# Patient Record
Sex: Male | Born: 1945 | Race: Black or African American | Hispanic: No | Smoking: Never smoker
Health system: Southern US, Community
[De-identification: ages and names within clinical notes are randomized; demographics above are authoritative.]

## PROBLEM LIST (undated history)

## (undated) DIAGNOSIS — K4090 Unilateral inguinal hernia, without obstruction or gangrene, not specified as recurrent: Secondary | ICD-10-CM

## (undated) DIAGNOSIS — N400 Enlarged prostate without lower urinary tract symptoms: Secondary | ICD-10-CM

## (undated) DIAGNOSIS — R972 Elevated prostate specific antigen [PSA]: Secondary | ICD-10-CM

## (undated) DIAGNOSIS — I10 Essential (primary) hypertension: Secondary | ICD-10-CM

## (undated) DIAGNOSIS — K469 Unspecified abdominal hernia without obstruction or gangrene: Secondary | ICD-10-CM

## (undated) DIAGNOSIS — C61 Malignant neoplasm of prostate: Secondary | ICD-10-CM

## (undated) DIAGNOSIS — Z923 Personal history of irradiation: Secondary | ICD-10-CM

## (undated) DIAGNOSIS — E785 Hyperlipidemia, unspecified: Secondary | ICD-10-CM

## (undated) DIAGNOSIS — T7840XA Allergy, unspecified, initial encounter: Secondary | ICD-10-CM

## (undated) HISTORY — DX: Hyperlipidemia, unspecified: E78.5

## (undated) HISTORY — DX: Benign prostatic hyperplasia without lower urinary tract symptoms: N40.0

## (undated) HISTORY — DX: Essential (primary) hypertension: I10

## (undated) HISTORY — DX: Elevated prostate specific antigen (PSA): R97.20

## (undated) HISTORY — DX: Allergy, unspecified, initial encounter: T78.40XA

## (undated) HISTORY — DX: Malignant neoplasm of prostate: C61

## (undated) HISTORY — PX: HERNIA REPAIR: SHX51

## (undated) HISTORY — DX: Unspecified abdominal hernia without obstruction or gangrene: K46.9

---

## 1998-09-16 ENCOUNTER — Emergency Department (HOSPITAL_COMMUNITY): Admission: EM | Admit: 1998-09-16 | Discharge: 1998-09-16 | Payer: Self-pay | Admitting: Emergency Medicine

## 2010-11-15 ENCOUNTER — Encounter (INDEPENDENT_AMBULATORY_CARE_PROVIDER_SITE_OTHER): Payer: Self-pay | Admitting: Surgery

## 2010-11-18 ENCOUNTER — Encounter (INDEPENDENT_AMBULATORY_CARE_PROVIDER_SITE_OTHER): Payer: Self-pay | Admitting: Surgery

## 2010-11-20 ENCOUNTER — Encounter (INDEPENDENT_AMBULATORY_CARE_PROVIDER_SITE_OTHER): Payer: Self-pay | Admitting: Surgery

## 2010-11-20 ENCOUNTER — Ambulatory Visit (INDEPENDENT_AMBULATORY_CARE_PROVIDER_SITE_OTHER): Payer: Medicare Other | Admitting: Surgery

## 2010-11-20 VITALS — BP 158/106 | HR 80 | Temp 98.2°F | Ht 68.25 in | Wt 188.0 lb

## 2010-11-20 DIAGNOSIS — K4090 Unilateral inguinal hernia, without obstruction or gangrene, not specified as recurrent: Secondary | ICD-10-CM | POA: Insufficient documentation

## 2010-11-20 NOTE — Progress Notes (Signed)
Matthew Wilkerson is a 65 y.o. male.    Chief Complaint  Patient presents with  . Inguinal Hernia    HPI HPI This is a 65 year old male who recently saw Dr. Isabel Caprice for an elevated PSA. The patient is scheduled for a transrectal ultrasound and prostate biopsy. The patient has had a left groin bulge for at least 9 years. It has become very large and occasionally is uncomfortable. It remains reducible. He had a right inguinal hernia repaired in 1979. He denies any GI obstructive symptoms. The hernia occasionally becomes uncomfortable with urination.  Past Medical History  Diagnosis Date  . Hyperlipidemia   . Hypertension   . Elevated PSA   . Urinary obstruction   . Hernia     Past Surgical History  Procedure Date  . Hernia repair     Family History  Problem Relation Age of Onset  . Cancer Sister     Social History History  Substance Use Topics  . Smoking status: Never Smoker   . Smokeless tobacco: Never Used  . Alcohol Use: No    Allergies  Allergen Reactions  . Aspirin     High dose asp.... He tolerates 81 mg     Current Outpatient Prescriptions  Medication Sig Dispense Refill  . amLODipine (NORVASC) 10 MG tablet Take 10 mg by mouth daily.        Marland Kitchen aspirin 81 MG tablet Take 81 mg by mouth daily.        . metoprolol (TOPROL-XL) 50 MG 24 hr tablet Take 50 mg by mouth daily.        . simvastatin (ZOCOR) 40 MG tablet Take 40 mg by mouth at bedtime.        . traMADol (ULTRAM) 50 MG tablet Take 50 mg by mouth every 6 (six) hours as needed.          Review of Systems ROS Positive for dentures hernia occasional headaches, otherwise negative Physical Exam Physical Exam   Blood pressure 158/106, pulse 80, temperature 98.2 F (36.8 C), height 5' 8.25" (1.734 m), weight 188 lb (85.276 kg). WDWN in NAD HEENT:  EOMI, sclera anicteric Neck:  No masses, no thyromegaly Lungs:  CTA bilaterally; normal respiratory effort CV:  Regular rate and rhythm; no murmurs Abd:   +bowel sounds, soft, non-tender, no masses GU:  Healed right inguinal incision with no hernia;  Very large left inguinal hernia; partially reducible; no testicular masses Ext:  Well-perfused; no edema Skin:  Warm, dry; no sign of jaundice  Assessment/Plan 1.  Very large left inguinal hernia 2.  Elevated PSA - biopsy pending  Plan:  Once the biopsy is complete, if the patient does not need any further work-up or treatment for his prostate, I would recommend a left inguinal hernia repair with mesh.  I described the procedure in detail.  The patient was given educational material. We discussed the risks and benefits including but not limited to bleeding, infection, chronic inguinal pain, nerve entrapment, hernia recurrence, mesh complications, hematoma formation, urinary retention, injury to the testicles or the ovaries, numbness in the groin, blood clots, injury to the surrounding structures, and anesthesia risk. We also discussed the typical post operative recovery course, including no heavy lifting for 6 weeks.  Gabreille Dardis K. 11/20/2010, 3:22 PM

## 2010-11-20 NOTE — Patient Instructions (Signed)
Once Dr. Isabel Caprice has given you the go-ahead, please call our schedulers at [phone] to schedule your hernia repair.

## 2011-02-14 ENCOUNTER — Other Ambulatory Visit (HOSPITAL_COMMUNITY): Payer: Medicare Other

## 2011-02-18 ENCOUNTER — Ambulatory Visit (HOSPITAL_COMMUNITY): Admission: RE | Admit: 2011-02-18 | Payer: Medicare Other | Source: Ambulatory Visit | Admitting: Surgery

## 2011-02-24 ENCOUNTER — Other Ambulatory Visit (INDEPENDENT_AMBULATORY_CARE_PROVIDER_SITE_OTHER): Payer: Self-pay | Admitting: Surgery

## 2011-04-01 ENCOUNTER — Telehealth (INDEPENDENT_AMBULATORY_CARE_PROVIDER_SITE_OTHER): Payer: Self-pay | Admitting: General Surgery

## 2011-04-01 NOTE — Telephone Encounter (Signed)
Called pt with of per op ov before sx for [facility] on 05/05/11 and mailed a reminder card to pt

## 2011-04-29 ENCOUNTER — Encounter (INDEPENDENT_AMBULATORY_CARE_PROVIDER_SITE_OTHER): Payer: Medicare Other | Admitting: Surgery

## 2011-05-05 ENCOUNTER — Ambulatory Visit (HOSPITAL_COMMUNITY): Admission: RE | Admit: 2011-05-05 | Payer: Medicare Other | Source: Ambulatory Visit | Admitting: Surgery

## 2011-05-05 ENCOUNTER — Encounter (HOSPITAL_COMMUNITY): Admission: RE | Payer: Self-pay | Source: Ambulatory Visit

## 2011-05-05 SURGERY — REPAIR, HERNIA, INGUINAL, ADULT
Anesthesia: General | Laterality: Left

## 2011-05-06 ENCOUNTER — Ambulatory Visit (INDEPENDENT_AMBULATORY_CARE_PROVIDER_SITE_OTHER): Payer: Medicare Other | Admitting: Emergency Medicine

## 2011-05-06 DIAGNOSIS — I10 Essential (primary) hypertension: Secondary | ICD-10-CM

## 2011-05-06 DIAGNOSIS — C61 Malignant neoplasm of prostate: Secondary | ICD-10-CM

## 2011-05-06 DIAGNOSIS — E785 Hyperlipidemia, unspecified: Secondary | ICD-10-CM

## 2011-07-20 ENCOUNTER — Other Ambulatory Visit: Payer: Self-pay | Admitting: Emergency Medicine

## 2011-07-28 ENCOUNTER — Ambulatory Visit (INDEPENDENT_AMBULATORY_CARE_PROVIDER_SITE_OTHER): Payer: Medicare Other | Admitting: Family Medicine

## 2011-07-28 VITALS — BP 135/84 | HR 71 | Temp 97.6°F | Resp 16 | Ht 67.25 in | Wt 191.6 lb

## 2011-07-28 DIAGNOSIS — I10 Essential (primary) hypertension: Secondary | ICD-10-CM

## 2011-07-28 DIAGNOSIS — S058X9A Other injuries of unspecified eye and orbit, initial encounter: Secondary | ICD-10-CM

## 2011-07-28 DIAGNOSIS — E785 Hyperlipidemia, unspecified: Secondary | ICD-10-CM

## 2011-07-28 DIAGNOSIS — E782 Mixed hyperlipidemia: Secondary | ICD-10-CM

## 2011-07-28 DIAGNOSIS — S0500XA Injury of conjunctiva and corneal abrasion without foreign body, unspecified eye, initial encounter: Secondary | ICD-10-CM

## 2011-07-28 MED ORDER — SIMVASTATIN 40 MG PO TABS: 40.0000 mg | ORAL_TABLET | Freq: Every day | ORAL | Status: DC

## 2011-07-28 MED ORDER — METOPROLOL SUCCINATE ER 100 MG PO TB24: 100.0000 mg | ORAL_TABLET | Freq: Every day | ORAL | Status: DC

## 2011-07-28 MED ORDER — POLYMYXIN B-TRIMETHOPRIM 10000-0.1 UNIT/ML-% OP SOLN: 1.0000 [drp] | OPHTHALMIC | Status: AC

## 2011-07-28 MED ORDER — AMLODIPINE BESYLATE 10 MG PO TABS: 10.0000 mg | ORAL_TABLET | Freq: Every day | ORAL | Status: DC

## 2011-07-28 NOTE — Progress Notes (Signed)
  Patient Name: Matthew Wilkerson Date of Birth: [birth year] Medical Record Number: [id number] Gender: male Date of Encounter: 07/28/2011  History of Present Illness:  Matthew Wilkerson is a 66 y.o. very pleasant male patient who presents with the following:  Noted that he got some dust or maybe a leaf in his eye this morning around 11:30.  He was outdoors and felt something go into his eye.  He now feels that he needs to rub his eye his eye in order to focus. He wears reading glasses only.  He does feel that his vision is as good as usual, and he does not note photophobia. He is aware that his right eye vision is not very good, but this is not new  Patient Active Problem List  Diagnoses  . Left inguinal hernia   Past Medical History  Diagnosis Date  . Hyperlipidemia   . Hypertension   . Elevated PSA   . Urinary obstruction   . Hernia    Past Surgical History  Procedure Date  . Hernia repair    History  Substance Use Topics  . Smoking status: Never Smoker   . Smokeless tobacco: Never Used  . Alcohol Use: No   Family History  Problem Relation Age of Onset  . Cancer Sister    Allergies  Allergen Reactions  . Aspirin     High dose asp.... He tolerates 81 mg     Medication list has been reviewed and updated.  Review of Systems: 1. Corneal abrasion  trimethoprim-polymyxin b (POLYTRIM) ophthalmic solution  2. Hypertension  metoprolol succinate (TOPROL-XL) 100 MG 24 hr tablet, amLODipine (NORVASC) 10 MG tablet  3. Hyperlipidemia  simvastatin (ZOCOR) 40 MG tablet     Physical Examination: Filed Vitals:   07/28/11 1517  BP: 135/84  Pulse: 71  Temp: 97.6 F (36.4 C)  TempSrc: Oral  Resp: 16  Height: 5' 7.25" (1.708 m)  Weight: 191 lb 9.6 oz (86.909 kg)    Body mass index is 29.79 kg/(m^2).  GEN: WDWN, NAD, Non-toxic, A & O x 3 HEENT: Atraumatic, Normocephalic. Neck supple. No masses, No LAD PEERL, EOMI, fundoscopic exam normal.  conjunctivae normal Ears and  Nose: No external deformity. CV: RRR, No M/G/R. No JVD. No thrill. No extra heart sounds. PULM: CTA B, no wheezes, crackles, rhonchi. No retractions. No resp. distress. No accessory muscle use. EXTR: No c/c/e NEURO Normal gait.  PSYCH: Normally interactive. Conversant. Not depressed or anxious appearing.  Calm demeanor.   Applied proparacaine drops to right eye and then performed fluorescin stain.  No abrasion but did visualize a small foreign body which I was able to flush from the eye   Assessment and Plan: 1. Corneal abrasion  trimethoprim-polymyxin b (POLYTRIM) ophthalmic solution  2. Hypertension  metoprolol succinate (TOPROL-XL) 100 MG 24 hr tablet, amLODipine (NORVASC) 10 MG tablet  3. Hyperlipidemia  simvastatin (ZOCOR) 40 MG tablet   Refilled other medications today- encouraged him to RTC for fasting labs and a recheck later this summer.  Foreign body to eye causing irritation- removed as above.  Gave polytrim drops to use for a few days. If not improving by tomorrow please call.

## 2011-07-29 ENCOUNTER — Telehealth: Payer: Self-pay

## 2011-07-29 NOTE — Telephone Encounter (Signed)
I think the best would be for the patient to RTC - if a corneal abrasion it should take 1-2d to heal, pt can also get some dry eye from irritation to eye that could be causing blurred vision due to lack of tears.

## 2011-07-29 NOTE — Telephone Encounter (Signed)
Spoke to pt,  Soreness gone, has to rub eye to focus.  Not blurred all the time, how long will it be until his eye is no longer blurry.  Please advise.

## 2011-07-29 NOTE — Telephone Encounter (Signed)
Spoke to pt, he is aware.  He will rtc if blurred vision has not resolved by tomorrow.  Informed of Dr. Brayton Layman schedule, and that anyone could see him.

## 2011-07-29 NOTE — Telephone Encounter (Signed)
Patient would like to speak to Dr. Patsy Lager because his symptoms have changed. He still is having blurred vision. Please advise.

## 2011-08-01 ENCOUNTER — Encounter: Payer: Self-pay | Admitting: Internal Medicine

## 2011-08-01 ENCOUNTER — Ambulatory Visit (INDEPENDENT_AMBULATORY_CARE_PROVIDER_SITE_OTHER): Payer: Medicare Other | Admitting: Internal Medicine

## 2011-08-01 VITALS — BP 121/80 | HR 94 | Temp 98.5°F | Resp 16 | Ht 67.0 in | Wt 191.4 lb

## 2011-08-01 DIAGNOSIS — I10 Essential (primary) hypertension: Secondary | ICD-10-CM

## 2011-08-01 DIAGNOSIS — H5710 Ocular pain, unspecified eye: Secondary | ICD-10-CM

## 2011-08-01 DIAGNOSIS — H538 Other visual disturbances: Secondary | ICD-10-CM

## 2011-08-01 NOTE — Patient Instructions (Signed)
Keratoconjunctivitis Sicca Keratoconjunctivitis sicca (dry eye) is dryness of the membranes surrounding the eye. It is caused by inadequate production of natural tears. The eyes must remain lubricated at all times. This creates a smooth surface of the cornea (clear covering at the front of the eye), which allows the eyelids to slide over the eye, without causing soreness and irritation. A small amount of tears are constantly produced by the tear glands (lacrimal glands). These glands are located under the outside part of the upper eyelids. Dry eyes are often caused by decreased tear production. This condition is called aqueous tear-deficient dry eyes. The tear glands do not produce enough tears to keep the tissues surrounding the eye moist. This condition is common in postmenopausal women. Dry eyes may also be caused by an abnormality of how the tears are made. This can result in faster evaporation of the tears. It is called evaporative dry eyes. Although the tear glands produce enough tears, the rate of evaporation is so fast that the entire eye surface cannot be kept covered with a complete layer of tears. The condition can occur during certain activities or in unusually dry surroundings.  Sometimes dry eyes are symptoms of other diseases that can affect the eyes. Some examples are:   Rheumatoid arthritis.   Systemic lupus erythematosus (lupus). Chronic inflammatory disease, causing the body to attack its own tissue.   Sjgren's syndrome. Dryness of the eyes and mouth, sometimes associated with a form of rheumatoid arthritis or lupus.  SYMPTOMS  Symptoms of dry eyes include:  Irritation.   Itching.   Redness.   Burning and feeling as though there is something gritty in the eye.  If the surface of the eye becomes damaged, sensitivity may increase, along with increased discomfort and sensitivity to bright light. Symptoms are made worse by activities with decreased blinking, such as staring at a  television, book, or computer screen.  Symptoms are also worse in dusty or smoky areas and in dry environments. Certain drugs can make symptoms worse, including antihistamines, tranquilizers, diuretics, anti-hypertensives (blood pressure medicine), and oral contraceptives. Symptoms tend to improve during cool, rainy, or foggy weather and in humid places, such as in the shower. DIAGNOSIS  Dry eyes are usually diagnosed by symptoms alone. Sometimes a Schirmer test is done, in which a strip of filter paper is placed at the edge of the eyelid. The amount of moisture bathing the eye is measured. This test is done by measuring how far the tears go up a strip of paper applied to the eye, in a set amount of time. Your caregiver will use a special microscope to examine the surface of the eye and the cornea, to see if there are signs of dryness. TREATMENT  Artificial tears (eye drops made with substances that simulate real tears) applied every few hours can generally control the problem. Lubricating ointments may help more severe cases. Avoiding dry, drafty environments and using humidifiers may also help. Minor surgery can be done to block the flow of tears into the nose, so that more tears are available to bathe the eyes. Patients with evaporative dry eyes may also benefit from treatment of the inflammation (redness and soreness) of the eyelids, which often occurs with the dry eyes. This treatment may include warm compresses, eyelid margin scrubs, or oral medicines. PROGNOSIS  Even with severe dry eyes, it is uncommon to lose vision. At times, it may become difficult to see. Rarely, scarring and ulcers may occur, and blood vessels can  grow across the cornea. Scarring and blood vessel growth can impair vision. In severe cases, the cornea may become damaged or infected. Ulcers or infections of the cornea are serious complications. PREVENTION  There is no way to prevent getting keratoconjunctivitis sicca. However,  complications can be prevented by keeping the eyes as moist as possible with artificial drops, as needed. SEEK IMMEDIATE MEDICAL CARE IF:   Your eye pain gets worse.   You develop a pus-like drainage from the eye.   The eye drops or medicines prescribed by your caregiver are not helping.   You have dry eyes and a sudden increase in discomfort or redness.   You have a sudden decrease in vision.   You have any other questions or concerns.  Document Released: 02/23/2004 Document Revised: 03/27/2011 Document Reviewed: 02/26/2009 [facility] Patient Information 2012 [place], [place].

## 2011-08-01 NOTE — Progress Notes (Signed)
  Subjective:    Patient ID: Matthew Wilkerson, male    DOB: [birth year], 66 y.o.   MRN: [id number]  HPI C/o R eye irritation persisting. Has no pain or discharge. Has more blurry R than L. Has not seen opthalmlogist in years.    Review of Systems Dr. Cleta Alberts FP    Objective:   Physical Exam Right eye appears normal Perrla, eom normal Fundascopic normal Opthain---lids everted and swabbed, no FB found Fluricine no FB or corneal lesion irrigated  Normal eye exam                 Assessment & Plan:  See opthalmologist names given Stop antibiotic drops Use artificial tears.

## 2011-08-07 ENCOUNTER — Ambulatory Visit: Payer: Medicare Other | Admitting: Family Medicine

## 2011-09-16 ENCOUNTER — Ambulatory Visit: Payer: Medicare Other | Admitting: Emergency Medicine

## 2011-09-20 DIAGNOSIS — R972 Elevated prostate specific antigen [PSA]: Secondary | ICD-10-CM

## 2011-09-20 HISTORY — DX: Elevated prostate specific antigen (PSA): R97.20

## 2012-01-08 DIAGNOSIS — C61 Malignant neoplasm of prostate: Secondary | ICD-10-CM

## 2012-01-08 HISTORY — DX: Malignant neoplasm of prostate: C61

## 2012-01-08 HISTORY — PX: PROSTATE BIOPSY: SHX241

## 2012-01-26 ENCOUNTER — Encounter: Payer: Self-pay | Admitting: *Deleted

## 2012-01-27 ENCOUNTER — Encounter: Payer: Self-pay | Admitting: Radiation Oncology

## 2012-01-27 ENCOUNTER — Ambulatory Visit
Admission: RE | Admit: 2012-01-27 | Discharge: 2012-01-27 | Disposition: A | Discharge status: Home / Self Care | Payer: Medicare Other | Source: Ambulatory Visit | Attending: Radiation Oncology | Admitting: Radiation Oncology

## 2012-01-27 VITALS — BP 144/73 | HR 85 | Temp 98.5°F | Resp 20 | Wt 196.6 lb

## 2012-01-27 DIAGNOSIS — C61 Malignant neoplasm of prostate: Secondary | ICD-10-CM | POA: Insufficient documentation

## 2012-01-27 HISTORY — DX: Unilateral inguinal hernia, without obstruction or gangrene, not specified as recurrent: K40.90

## 2012-01-27 NOTE — Progress Notes (Signed)
New Consult Prostate Cancer DX 11/2010  BX:01/08/12 Adenocarcinoma,gleason=3+3=6,& 3+4=7,Volume=43cc,PSA+7.63 09/2011 PSA=7.6 04/2011 PSA=7.7  Single, 2 children [employer]  No dysuria, gets up 1-2x night to void, sometimes feels not emptying bladder completely, some urgency at times Allergies: Asa, upset stomach but takes low dose asa, . TBEC, ,Codeine derivatives=dizziness

## 2012-01-27 NOTE — Progress Notes (Signed)
[facility] Radiation Oncology NEW PATIENT EVALUATION  Name: Matthew Wilkerson MRN: [id number]  Date:   01/27/2012           DOB: 11-21-1945  Status: outpatient   CC: DAUB, Stan Head, MD  Valetta Fuller, MD    REFERRING PHYSICIAN: Valetta Fuller, MD   DIAGNOSIS: Stage TI C. intermediate risk adenocarcinoma prostate   HISTORY OF PRESENT ILLNESS:  Matthew Wilkerson is a 66 y.o. male who is seen today for the courtesy Dr. Barron Alvine for discussion of possible radiation therapy in the management of his stage TI C. intermediate risk adenocarcinoma prostate. He was diagnosed with prostate cancer on 12/10/2010 after he presented with a PSA of 7.18. His found to have risen 6 (3+3) involving 5% of one core from the right base, 5% of one core from the right mid gland, 10% of one core from right lateral apex and 10% of one core from the right apex. He also had multiple areas of high-grade PIN. He elected for close surveillance. His PSA from 09/25/2011 was 7.63. He underwent repeat biopsies on 01/08/2012. He was again found have Gleason 6 (3+3) involving less than 5% of one core from right lateral base, less than 5% of one core from the right base, 5% of one core from the right lateral apex, but also Gleason 7 (3+4) involving 20% of one core from the left apex. His gland volume was 43 cc. His I PSS score Dr. Isabel Caprice was 8 and also 19 in our office today. He was given a trial of VESIcare, but this was poorly tolerated, causing some discomfort with urination. I he was also given a trial of an alpha blocker which was also poorly tolerated. No GI difficulties. He does have erectile dysfunction.  PREVIOUS RADIATION THERAPY: No   PAST MEDICAL HISTORY:  has a past medical history of Hyperlipidemia; Hypertension; Elevated PSA; Urinary obstruction; Hernia; Allergy; Prostate cancer (dx 11/2010); Prostate ca (01/08/12); Abnormal PSA (09/2011); BPH (benign prostatic hyperplasia); and Inguinal hernia.      PAST SURGICAL HISTORY:  Past Surgical History  Procedure Date  . Hernia repair     right inguinal done in early 70's  . Prostate biopsy 01/08/12    Adenocarcinoma,gleason=3+3=6,& 3+4=7,volume=43cc      FAMILY HISTORY: family history includes Cancer (age of onset:59) in his sister.   SOCIAL HISTORY:  reports that he has never smoked. He has never used smokeless tobacco. He reports that he does not drink alcohol or use illicit drugs.   ALLERGIES: Aspirin and Codeine   MEDICATIONS:  Current Outpatient Prescriptions  Medication Sig Dispense Refill  . amLODipine (NORVASC) 10 MG tablet Take 1 tablet (10 mg total) by mouth daily.  90 tablet  3  . aspirin 81 MG tablet Take 81 mg by mouth daily.        . metoprolol succinate (TOPROL-XL) 100 MG 24 hr tablet Take 1 tablet (100 mg total) by mouth daily. Take with or immediately following a meal.  90 tablet  3  . simvastatin (ZOCOR) 40 MG tablet Take 1 tablet (40 mg total) by mouth at bedtime.  90 tablet  3  . traMADol (ULTRAM) 50 MG tablet Take 50 mg by mouth every 6 (six) hours as needed.           REVIEW OF SYSTEMS:  Pertinent items are noted in HPI.    PHYSICAL EXAM:  weight is 196 lb 9.6 oz (89.177 kg). His oral temperature is  98.5 F (36.9 C). His blood pressure is 144/73 and his pulse is 85. His respiration is 20.   Head and neck examination: Grossly unremarkable. Nodes: Without palpable cervical or supraclavicular lymphadenopathy. Chest: Lungs clear. Heart: Regular rate rhythm. Back: Without spinal or CVA tenderness. Abdomen: Without masses organomegaly. He does have moderate sized left inguinal hernia. Genitalia: Grossly unremarkable. Rectal: The prostate gland is normal size and is without focal induration or nodularity. Extremities: Without edema. Neurologic examination: Grossly nonfocal.   LABORATORY DATA:  No results found for this basename: WBC, HGB, HCT, MCV, PLT   No results found for this basename: NA, K, CL, CO2    No results found for this basename: ALT, AST, GGT, ALKPHOS, BILITOT   PSA 7.63 from 09/25/2011.    IMPRESSION: Stage TI C. intermediate risk adenocarcinoma prostate. I explained to the patient and his wife that his prognosis is related to his stage, PSA level, and Gleason score. His stage and PSA level are favorable while his Gleason score of 7 is of intermediate favorability. Other prognostic factors include PSA doubling time and disease volume. We discussed surgery versus continued observation versus radiation therapy. Ration therapy options include seed implantation with or without 5 weeks of external beam or 8 weeks of external beam/IMRT. I am concerned about his obstructive symptoms score of 19. I believe that he has been given a trial of both antispasmodic and an alpha blocker which were both poorly tolerated. I would be reluctant to implant the patient with an I PSS score greater than 15. I'll see Dr. Isabel Caprice to see if he needs to undergo urodynamics to better assess his suspected obstructive symptomatology. At this point in time we would be left with 8 weeks of external beam/IMRT. I discussed the potential acute and late toxicities of radiation therapy, and he is somewhat interested in external beam/IMRT. On other hand, I feel that he would also be a candidate for seed implantation alone, that we can improve his I PSS score of 19. Consent for possible radiation therapy is signed today. I left a message with Dr. Isabel Caprice, and we can discuss his management.   PLAN: As discussed above.   I spent 60 minutes minutes face to face with the patient and more than 50% of that time was spent in counseling and/or coordination of care.

## 2012-01-27 NOTE — Progress Notes (Signed)
Please see the Nurse Progress Note in the MD Initial Consult Encounter for this patient. 

## 2012-01-28 ENCOUNTER — Encounter: Payer: Self-pay | Admitting: Radiation Oncology

## 2012-01-28 NOTE — Progress Notes (Signed)
I spoke with Dr. Isabel Caprice earlier today. In view of his I PSS score of 19, feel that we should further evaluate his "obstructive" symptoms with urodynamics. He is currently not a candidate for seed implantation based on his I PSS score is 19. Even though he is somewhat leaning towards external beam/IMRT, I would like to optimize his urinary status. Dr. Isabel Caprice will get him in and let me know when he is to of his evaluation.

## 2012-02-06 NOTE — Addendum Note (Signed)
Encounter addended by: Lowella Petties, RN on: 02/06/2012 12:04 PM<BR>     Documentation filed: Charges VN

## 2012-06-14 ENCOUNTER — Encounter: Payer: Self-pay | Admitting: Radiation Oncology

## 2012-06-14 NOTE — Progress Notes (Signed)
Dr. Isabel Caprice called me this morning to tell me that Matthew Wilkerson has better urinary flow. He was started on Lupron in late December and should be ready to begin radiation therapy sometime in March. I requested that we place 3 gold seed markers within the prostate. I will see him for a followup visit within the next 2-3 weeks and then get him scheduled for simulation/IMRT.

## 2012-06-16 ENCOUNTER — Telehealth: Payer: Self-pay | Admitting: *Deleted

## 2012-06-16 NOTE — Telephone Encounter (Signed)
CALLED PATIENT TO INFORM OF APPT. WITH DR. Dayton Scrape ON 07-06-12- ARRIVAL TIME - 2:00 PM, I ALSO TOLD HIM THAT I HAVE HIS GOLD SEEDS ARRANGED TO BE PLACED ON 08-20-12 AT 1:30 PM AT DR. Ellin Goodie  OFFICE, I LVM FOR DR. Ellin Goodie NURSE TO GET HIM IN [facility] [place] FOR GOLD SEED PLACEMENT , I TOLD HIM THAT I WOULD CALL HIM LATER WITH THAT NEW DATE.

## 2012-06-17 ENCOUNTER — Telehealth: Payer: Self-pay | Admitting: *Deleted

## 2012-06-17 NOTE — Telephone Encounter (Signed)
CALLED PATIENT TO INFORM OF GOLD SEED PLACEMENT FOR 07-13-12 - ARRIVAL TIME - 1:15 PM AT DR. Ellin Goodie OFFICE AND HIS VISIT WITH DR. Dayton Scrape ON 07-14-12 - ARRIVAL TIME - 2;00 PM, SPOKE WITH PATIENT AND HE IS AWARE OF THESE APPTS. AND I HAVE MAILED THE PATIENT INFO. REGARDING THESE APPTS.

## 2012-07-06 ENCOUNTER — Ambulatory Visit: Payer: Medicare Other

## 2012-07-06 ENCOUNTER — Ambulatory Visit: Payer: Medicare Other | Admitting: Radiation Oncology

## 2012-07-09 ENCOUNTER — Encounter: Payer: Self-pay | Admitting: Oncology

## 2012-07-14 ENCOUNTER — Encounter: Payer: Self-pay | Admitting: Radiation Oncology

## 2012-07-14 ENCOUNTER — Ambulatory Visit
Admission: RE | Admit: 2012-07-14 | Discharge: 2012-07-14 | Disposition: A | Discharge status: Home / Self Care | Payer: Medicare Other | Source: Ambulatory Visit | Attending: Radiation Oncology | Admitting: Radiation Oncology

## 2012-07-14 VITALS — BP 136/81 | HR 66 | Temp 97.7°F | Resp 20 | Wt 202.7 lb

## 2012-07-14 DIAGNOSIS — C61 Malignant neoplasm of prostate: Secondary | ICD-10-CM | POA: Insufficient documentation

## 2012-07-14 DIAGNOSIS — R351 Nocturia: Secondary | ICD-10-CM | POA: Insufficient documentation

## 2012-07-14 DIAGNOSIS — R3915 Urgency of urination: Secondary | ICD-10-CM | POA: Insufficient documentation

## 2012-07-14 NOTE — Progress Notes (Addendum)
CC: Dr. Barron Alvine   Diagnosis: Stage TI C. intermediate risk adenocarcinoma prostate  History: Mr. Matthew Wilkerson returns today for review and scheduling of his radiation therapy. I first saw the patient consultation on 01/27/2012. He presented with Gleason 7 disease along with a PSA of 7.18. His gland volume was 43 cc. He had significant obstructive symptomatology with an I PSS score of 19. He is being considered for seed implantation versus external beam/IMRT. I do not feel that he would be a good candidate for seed implantation because of his obstructive symptomatology. He underwent video urodynamics and was given a trial of blockers and antispasmodic medication. There was minimal improvement. He was started on short course androgen deprivation this past December. His urination is improved and his I PSS score is 10. 3 gold seed fiducial markers were implanted by Dr. Isabel Caprice yesterday.  Physical examination: Alert and oriented. Wt Readings from Last 3 Encounters:  07/14/12 202 lb 11.2 oz (91.944 kg)  01/27/12 196 lb 9.6 oz (89.177 kg)  08/01/11 191 lb 6.4 oz (86.818 kg)   Temp Readings from Last 3 Encounters:  07/14/12 97.7 F (36.5 C) Oral  01/27/12 98.5 F (36.9 C) Oral  08/01/11 98.5 F (36.9 C) Oral   BP Readings from Last 3 Encounters:  07/14/12 136/81  01/27/12 144/73  08/01/11 121/80   Pulse Readings from Last 3 Encounters:  07/14/12 66  01/27/12 85  08/01/11 94   Rectal examination: The prostate gland is small and is without focal induration or nodularity.  Impression: Stage TI C. intermediate risk adenocarcinoma prostate. He was again discussed the potential acute and late toxicities of radiation therapy. We also discussed bladder filling. He has to 3 days in April that he would not be able to come in for treatment, therefore will have him return for treatment planning after his out-of-town obligations in mid April. Dr. Isabel Caprice may want to give one more Depo-Lupron injection  that we care he him through his external beam/IMRT. Consent was previously signed.  Plan: As discussed above.  30 minutes was spent face-to-face with the patient, primarily counseling the patient and coordinating his care.

## 2012-07-14 NOTE — Progress Notes (Signed)
Please see the Nurse Progress Note in the MD Initial Consult Encounter for this patient. 

## 2012-07-14 NOTE — Progress Notes (Addendum)
Had Lupron injection late Dec 2013, next dose 08/10/12. Pt states he has hot flashes. Pt has nocturia x2, urinary urgency, weak stream. IPSS score. Pt denies pain, fatigue, loss of appetite.

## 2012-07-14 NOTE — Addendum Note (Signed)
Encounter addended by: Maryln Gottron, MD on: 07/14/2012  3:25 PM<BR>     Documentation filed: Notes Section

## 2012-07-19 NOTE — Addendum Note (Signed)
Encounter addended by: Eduardo Osier, RN on: 07/19/2012  1:13 PM<BR>     Documentation filed: Charges VN

## 2012-08-07 ENCOUNTER — Other Ambulatory Visit: Payer: Self-pay | Admitting: Family Medicine

## 2012-08-09 ENCOUNTER — Telehealth: Payer: Self-pay | Admitting: Radiation Oncology

## 2012-08-09 ENCOUNTER — Ambulatory Visit
Admission: RE | Admit: 2012-08-09 | Discharge: 2012-08-09 | Disposition: A | Discharge status: Home / Self Care | Payer: Medicare Other | Source: Ambulatory Visit | Attending: Radiation Oncology | Admitting: Radiation Oncology

## 2012-08-09 DIAGNOSIS — Z510 Encounter for antineoplastic radiation therapy: Secondary | ICD-10-CM | POA: Insufficient documentation

## 2012-08-09 DIAGNOSIS — C61 Malignant neoplasm of prostate: Secondary | ICD-10-CM | POA: Insufficient documentation

## 2012-08-09 NOTE — Telephone Encounter (Signed)
Met w patient to discuss RO billing. Pt had no financial concerns today.  Dx: Prostate cancer - Primary 185   Attending Rad:  RM   Rad Tx:  IMRT

## 2012-08-09 NOTE — Progress Notes (Signed)
Complex CT simulation/treatment planning note: The patient was taken to the CT simulator and placed supine. A custom vac lock immobilization device was constructed. A rubber catheter was placed within the rectal vault. He was in catheterized and contrast instilled into his bladder and urethra. His pelvis was scanned. I contoured his prostate, seminal vesicles, bladder, and rectum. The femoral heads and bowel be contoured by dosimetry. I prescribing 7800 cGy 40 sessions to his prostate PTV which are represents the prostate was 0.8 cm except for 0.5 cm along the rectum. I prescribing 5600 cGy in 40 sessions to his seminal vesicles which represents the seminal vesicles +0.5 cm. He is now ready for IMRT simulation/treatment planning. He is be treated with a comfortably full bladder. He'll undergo daily image guidance with a weekly cone beam CT setting up to his gold seeds. I want to perform cone beam CT and not KV imaging because of 2 (adjacent to the rectum which may migrate.

## 2012-08-11 ENCOUNTER — Encounter: Payer: Self-pay | Admitting: Radiation Oncology

## 2012-08-11 NOTE — Progress Notes (Signed)
IMRT simulation/treatment planning note: Matthew Wilkerson completed his IMRT simulation/treatment planning in the management of his carcinoma the prostate. IMRT was chosen to decrease the risk for both acute and late bladder and rectal toxicity compared to conventional or 3-D conformal radiation therapy. Dose volume histograms were obtained for the target structures including the prostate and seminal vesicle PTV is also avoidance structures including the bladder, rectum, and femoral heads. We met our departmental goals. I'm prescribing 7800 cGy 40 sessions to the prostate PTV and 5600 cGy in 40 sessions to his seminal vesicle PTV. I requesting daily image guidance with cone beam CT to be a close eye on his gold seeds with one or 2 gold seeds appearing to be adjacent to the rectum. He is be treated with a comfortably full bladder.

## 2012-08-14 ENCOUNTER — Other Ambulatory Visit: Payer: Self-pay | Admitting: Family Medicine

## 2012-08-17 ENCOUNTER — Ambulatory Visit: Payer: Medicare Other | Admitting: Emergency Medicine

## 2012-08-17 ENCOUNTER — Ambulatory Visit (INDEPENDENT_AMBULATORY_CARE_PROVIDER_SITE_OTHER): Payer: Medicare Other | Admitting: Internal Medicine

## 2012-08-17 VITALS — BP 124/83 | HR 76 | Temp 98.1°F | Resp 18 | Wt 198.0 lb

## 2012-08-17 DIAGNOSIS — E785 Hyperlipidemia, unspecified: Secondary | ICD-10-CM

## 2012-08-17 DIAGNOSIS — R519 Headache, unspecified: Secondary | ICD-10-CM | POA: Insufficient documentation

## 2012-08-17 DIAGNOSIS — I10 Essential (primary) hypertension: Secondary | ICD-10-CM

## 2012-08-17 DIAGNOSIS — R51 Headache: Secondary | ICD-10-CM

## 2012-08-17 MED ORDER — AMLODIPINE BESYLATE 10 MG PO TABS: 10.0000 mg | ORAL_TABLET | Freq: Every day | ORAL | Status: DC

## 2012-08-17 MED ORDER — METOPROLOL SUCCINATE ER 100 MG PO TB24: 100.0000 mg | ORAL_TABLET | Freq: Every day | ORAL | Status: DC

## 2012-08-17 MED ORDER — SIMVASTATIN 40 MG PO TABS: 40.0000 mg | ORAL_TABLET | Freq: Every day | ORAL | Status: DC

## 2012-08-17 MED ORDER — TRAMADOL HCL 50 MG PO TABS: 50.0000 mg | ORAL_TABLET | Freq: Four times a day (QID) | ORAL | Status: DC | PRN

## 2012-08-17 NOTE — Progress Notes (Signed)
Subjective:    Patient ID: Matthew Wilkerson, male    DOB: [birth year], 67 y.o.   MRN: [id number]  HPI Pt presents to clinic today for refills for Norvasc, Toprol-XL, Ultram, and Zocor. Pt states he  Gets meds from Dr Cleta Alberts. See ref for prost-now seeing Dr Dayton Scrape for radiation Had PE plus labs done w/ cancer ctr 1 month ago and adamant that they checked all incl lipids Feels well HAs are seldom and resp to ultram  Past Medical History  Diagnosis Date  . Hyperlipidemia   . Hypertension   . Elevated PSA   . Urinary obstruction   . Hernia   . Allergy   . Prostate cancer dx 11/2010    prostate ca,PSA=7.2 T1c volume=30cc  . Prostate ca 01/08/12    bx=Adenocarcinoma, gleason 6 (3+3), prostate volume 43 cc  . Abnormal PSA 09/2011    7.63,   . BPH (benign prostatic hyperplasia)   . Inguinal hernia     left   Current outpatient prescriptions:amLODipine (NORVASC) 10 MG tablet, Take 1 tablet (10 mg total) by mouth daily., Disp: 30 tablet, Rfl: 5;  aspirin 81 MG tablet, Take 81 mg by mouth daily.  , Disp: , Rfl: ;  metoprolol succinate (TOPROL-XL) 100 MG 24 hr tablet, Take 1 tablet (100 mg total) by mouth daily. Take with or immediately following a meal., Disp: 30 tablet, Rfl: 5 traMADol (ULTRAM) 50 MG tablet, Take 1 tablet (50 mg total) by mouth every 6 (six) hours as needed., Disp: 30 tablet, Rfl: 0;  simvastatin (ZOCOR) 40 MG tablet, Take 1 tablet (40 mg total) by mouth at bedtime., Disp: 30 tablet, Rfl: 5  Denies side effects  Review of Systems  Constitutional: Negative for activity change, appetite change, fatigue and unexpected weight change.  HENT: Negative for hearing loss and neck stiffness.   Eyes: Negative for photophobia and visual disturbance.  Respiratory: Negative for shortness of breath.   Cardiovascular: Negative for chest pain, palpitations and leg swelling.  Gastrointestinal: Negative for abdominal pain, diarrhea and constipation.  Genitourinary: Negative for difficulty  urinating.  Musculoskeletal: Negative for gait problem.  Neurological: Negative for speech difficulty, weakness and light-headedness.  Psychiatric/Behavioral: Negative for behavioral problems and sleep disturbance. The patient is not nervous/anxious.        Objective:   Physical Exam BP 124/83  Pulse 76  Temp(Src) 98.1 F (36.7 C) (Oral)  Resp 18  Wt 198 lb (89.812 kg)  BMI 31 kg/m2 HEENT clear Heart regular without murmur rub or gallop No carotid bruits No peripheral edema Neurological intact       Assessment & Plan:  Hypertension - Other and unspecified hyperlipidemia  Headache  Meds ordered this encounter  Medications  . traMADol (ULTRAM) 50 MG tablet    Sig: Take 1 tablet (50 mg total) by mouth every 6 (six) hours as needed.    Dispense:  30 tablet    Refill:  0  . simvastatin (ZOCOR) 40 MG tablet    Sig: Take 1 tablet (40 mg total) by mouth at bedtime.    Dispense:  30 tablet    Refill:  5  . metoprolol succinate (TOPROL-XL) 100 MG 24 hr tablet    Sig: Take 1 tablet (100 mg total) by mouth daily. Take with or immediately following a meal.    Dispense:  30 tablet    Refill:  5  . amLODipine (NORVASC) 10 MG tablet    Sig: Take 1 tablet (10 mg total) by mouth  daily.    Dispense:  30 tablet    Refill:  5   Will have him return in 6 months for labs He is unsure about immunizations/his colonoscopy is up-to-date

## 2012-08-22 ENCOUNTER — Encounter: Payer: Self-pay | Admitting: Radiation Oncology

## 2012-08-22 NOTE — Progress Notes (Signed)
  Radiation Oncology         [phone] ________________________________  Name: Matthew Wilkerson MRN: [id number]  Date: 08/22/2012  DOB: 05-16-1945  On-call Telephone contact:  I received a message to call this patient and returned the phone call.  The patient's daughter answer the phone while he listened in on the other line. The patient's daughter related that the patient elected to proceed with radiation therapy before she had an opportunity to ask questions and discuss the treatment in detail. The patient was scheduled to begin radiation treatment on Monday, May 5. She requested that we cancel his first treatment appointment and set up time to talk about the planned course of therapy. I related this message to our scheduling staff voice mail.  ________________________________  Artist Pais. Kathrynn Running, M.D.

## 2012-08-23 ENCOUNTER — Ambulatory Visit
Admission: RE | Admit: 2012-08-23 | Discharge: 2012-08-23 | Disposition: A | Discharge status: Home / Self Care | Payer: Medicare Other | Source: Ambulatory Visit | Attending: Radiation Oncology | Admitting: Radiation Oncology

## 2012-08-23 VITALS — BP 135/78 | HR 73 | Temp 97.4°F | Wt 198.6 lb

## 2012-08-23 DIAGNOSIS — C61 Malignant neoplasm of prostate: Secondary | ICD-10-CM

## 2012-08-23 NOTE — Progress Notes (Signed)
Patient here for routine weekly assessment of radiation to pelvis for prostate cancer.Completed 1 of 40 treatments.Routine of clinic reviewed and general side effects.Given Radiation Therapy and You Booklet to review and will reinforce teaching and answer questions on Wednesday of this week.

## 2012-08-23 NOTE — Progress Notes (Signed)
Weekly Management Note:  Site: Prostate Current Dose:  195  cGy Projected Dose: 7800  cGy  Narrative: The patient is seen today for routine under treatment assessment. CBCT/MVCT images/port films were reviewed. The chart was reviewed.   No new GU or GI complaints. Bladder filling is excellent today. I had a lengthy discussion with his wife earlier today reviewing potential acute and late toxicities of radiation therapy. She was concerned he may have serious fatigue and become incapacitated.  Physical Examination:  Filed Vitals:   08/23/12 1138  BP: 135/78  Pulse: 73  Temp: 97.4 F (36.3 C)  .  Weight: 198 lb 9.6 oz (90.084 kg). No change.  Impression: Tolerating radiation therapy well.  Plan: Continue radiation therapy as planned.

## 2012-08-23 NOTE — Progress Notes (Signed)
Chart note: Mr. Matthew Wilkerson began his radiation therapy/IMRT today in the management of his carcinoma the prostate. He was treated with VMAT utilizing 2 modulated arcs with 2 dynamic sets of MLCs corresponding to one set of IMRT treatment devices [phone])

## 2012-08-24 ENCOUNTER — Ambulatory Visit
Admission: RE | Admit: 2012-08-24 | Discharge: 2012-08-24 | Disposition: A | Discharge status: Home / Self Care | Payer: Medicare Other | Source: Ambulatory Visit | Attending: Radiation Oncology | Admitting: Radiation Oncology

## 2012-08-25 ENCOUNTER — Ambulatory Visit
Admission: RE | Admit: 2012-08-25 | Discharge: 2012-08-25 | Disposition: A | Discharge status: Home / Self Care | Payer: Medicare Other | Source: Ambulatory Visit | Attending: Radiation Oncology | Admitting: Radiation Oncology

## 2012-08-26 ENCOUNTER — Ambulatory Visit
Admission: RE | Admit: 2012-08-26 | Discharge: 2012-08-26 | Disposition: A | Discharge status: Home / Self Care | Payer: Medicare Other | Source: Ambulatory Visit | Attending: Radiation Oncology | Admitting: Radiation Oncology

## 2012-08-27 ENCOUNTER — Ambulatory Visit
Admission: RE | Admit: 2012-08-27 | Discharge: 2012-08-27 | Disposition: A | Discharge status: Home / Self Care | Payer: Medicare Other | Source: Ambulatory Visit | Attending: Radiation Oncology | Admitting: Radiation Oncology

## 2012-08-28 ENCOUNTER — Ambulatory Visit
Admission: RE | Admit: 2012-08-28 | Discharge: 2012-08-28 | Disposition: A | Discharge status: Home / Self Care | Payer: Medicare Other | Source: Ambulatory Visit | Attending: Radiation Oncology | Admitting: Radiation Oncology

## 2012-08-30 ENCOUNTER — Ambulatory Visit
Admission: RE | Admit: 2012-08-30 | Discharge: 2012-08-30 | Disposition: A | Discharge status: Home / Self Care | Payer: Medicare Other | Source: Ambulatory Visit | Attending: Radiation Oncology | Admitting: Radiation Oncology

## 2012-08-30 VITALS — BP 127/81 | HR 73 | Temp 98.0°F | Wt 198.8 lb

## 2012-08-30 DIAGNOSIS — C61 Malignant neoplasm of prostate: Secondary | ICD-10-CM

## 2012-08-30 NOTE — Progress Notes (Signed)
Patient here for routine weekly assessment of radiation to pelvis for prostate cancer.Completed 6 of 40 treatments.Reviewed routine of clinic and answered questions regarding what was read in Radiation Therapy and You Booklet.Side effects reviewed and how to take care of self regarding bowel and bladder as well as fatigue.No changes from radiation.Knows to have full bladder for treatment.

## 2012-08-30 NOTE — Progress Notes (Signed)
   Weekly Management Note:  outpatient Current Dose:  11.7 Gy  Projected Dose: 78 Gy   Narrative:  The patient presents for routine under treatment assessment.  CBCT/MVCT images/Port film x-rays were reviewed.  The chart was checked. Doing well. No new complaints  Physical Findings:  weight is 198 lb 12.8 oz (90.175 kg). His temperature is 98 F (36.7 C). His blood pressure is 127/81 and his pulse is 73.  NAD  Impression:  The patient is tolerating radiotherapy.  Plan:  Continue radiotherapy as planned.  ________________________________   Lonie Peak, M.D.

## 2012-08-31 ENCOUNTER — Ambulatory Visit
Admission: RE | Admit: 2012-08-31 | Discharge: 2012-08-31 | Disposition: A | Discharge status: Home / Self Care | Payer: Medicare Other | Source: Ambulatory Visit | Attending: Radiation Oncology | Admitting: Radiation Oncology

## 2012-09-01 ENCOUNTER — Ambulatory Visit
Admission: RE | Admit: 2012-09-01 | Discharge: 2012-09-01 | Disposition: A | Discharge status: Home / Self Care | Payer: Medicare Other | Source: Ambulatory Visit | Attending: Radiation Oncology | Admitting: Radiation Oncology

## 2012-09-02 ENCOUNTER — Ambulatory Visit
Admission: RE | Admit: 2012-09-02 | Discharge: 2012-09-02 | Disposition: A | Discharge status: Home / Self Care | Payer: Medicare Other | Source: Ambulatory Visit | Attending: Radiation Oncology | Admitting: Radiation Oncology

## 2012-09-03 ENCOUNTER — Ambulatory Visit
Admission: RE | Admit: 2012-09-03 | Discharge: 2012-09-03 | Disposition: A | Discharge status: Home / Self Care | Payer: Medicare Other | Source: Ambulatory Visit | Attending: Radiation Oncology | Admitting: Radiation Oncology

## 2012-09-06 ENCOUNTER — Ambulatory Visit
Admission: RE | Admit: 2012-09-06 | Discharge: 2012-09-06 | Disposition: A | Discharge status: Home / Self Care | Payer: Medicare Other | Source: Ambulatory Visit | Attending: Radiation Oncology | Admitting: Radiation Oncology

## 2012-09-06 VITALS — BP 133/83 | HR 75 | Temp 98.3°F | Wt 201.1 lb

## 2012-09-06 DIAGNOSIS — C61 Malignant neoplasm of prostate: Secondary | ICD-10-CM

## 2012-09-06 NOTE — Progress Notes (Signed)
Patient here for weekly assessment of prostate radiation.Completed 11 of 40 treatments.Doing well.No pain.Having hot flashes but no bowel or bladder problems.

## 2012-09-06 NOTE — Progress Notes (Signed)
Weekly Management Note:  Site: Prostate Current Dose:  2145  cGy Projected Dose: 7800 cGy  Narrative: The patient is seen today for routine under treatment assessment. CBCT/MVCT images/port films were reviewed. The chart was reviewed.   Bladder filling satisfactory. No new GU or GI difficulties. Physical Examination:  Filed Vitals:   09/06/12 0908  BP: 133/83  Pulse: 75  Temp: 98.3 F (36.8 C)  .  Weight: 201 lb 1.6 oz (91.218 kg). No change.  Impression: Tolerating radiation therapy well.  Plan: Continue radiation therapy as planned.

## 2012-09-07 ENCOUNTER — Ambulatory Visit
Admission: RE | Admit: 2012-09-07 | Discharge: 2012-09-07 | Disposition: A | Discharge status: Home / Self Care | Payer: Medicare Other | Source: Ambulatory Visit | Attending: Radiation Oncology | Admitting: Radiation Oncology

## 2012-09-08 ENCOUNTER — Ambulatory Visit
Admission: RE | Admit: 2012-09-08 | Discharge: 2012-09-08 | Disposition: A | Discharge status: Home / Self Care | Payer: Medicare Other | Source: Ambulatory Visit | Attending: Radiation Oncology | Admitting: Radiation Oncology

## 2012-09-09 ENCOUNTER — Ambulatory Visit
Admission: RE | Admit: 2012-09-09 | Discharge: 2012-09-09 | Disposition: A | Discharge status: Home / Self Care | Payer: Medicare Other | Source: Ambulatory Visit | Attending: Radiation Oncology | Admitting: Radiation Oncology

## 2012-09-10 ENCOUNTER — Ambulatory Visit
Admission: RE | Admit: 2012-09-10 | Discharge: 2012-09-10 | Disposition: A | Discharge status: Home / Self Care | Payer: Medicare Other | Source: Ambulatory Visit | Attending: Radiation Oncology | Admitting: Radiation Oncology

## 2012-09-11 ENCOUNTER — Other Ambulatory Visit: Payer: Self-pay | Admitting: Family Medicine

## 2012-09-14 ENCOUNTER — Ambulatory Visit
Admission: RE | Admit: 2012-09-14 | Discharge: 2012-09-14 | Disposition: A | Discharge status: Home / Self Care | Payer: Medicare Other | Source: Ambulatory Visit | Attending: Radiation Oncology | Admitting: Radiation Oncology

## 2012-09-14 ENCOUNTER — Encounter: Payer: Self-pay | Admitting: Radiation Oncology

## 2012-09-14 VITALS — BP 135/80 | HR 76 | Temp 97.6°F | Ht 67.0 in | Wt 200.1 lb

## 2012-09-14 DIAGNOSIS — C61 Malignant neoplasm of prostate: Secondary | ICD-10-CM

## 2012-09-14 NOTE — Progress Notes (Signed)
Weekly Management Note:  Site: Prostate Current Dose:  3120  cGy Projected Dose: 7800  cGy  Narrative: The patient is seen today for routine under treatment assessment. CBCT/MVCT images/port films were reviewed. The chart was reviewed.   Bladder filling is excellent. No new GU or GI difficulties.  Physical Examination:  Filed Vitals:   09/14/12 0932  BP: 135/80  Pulse: 76  Temp: 97.6 F (36.4 C)  .  Weight: 200 lb 1.6 oz (90.765 kg). No change.  Impression: Tolerating radiation therapy well.  Plan: Continue radiation therapy as planned.

## 2012-09-14 NOTE — Progress Notes (Signed)
Matthew Wilkerson has received 16 fractions to his prostate.  He states he has urinary frequency when he wakes in the am.  He has nocturia 1-2 times.  He denies any dysuria or proctitis.   He reports   intermittent fatigue.

## 2012-09-15 ENCOUNTER — Ambulatory Visit
Admission: RE | Admit: 2012-09-15 | Discharge: 2012-09-15 | Disposition: A | Discharge status: Home / Self Care | Payer: Medicare Other | Source: Ambulatory Visit | Attending: Radiation Oncology | Admitting: Radiation Oncology

## 2012-09-16 ENCOUNTER — Ambulatory Visit
Admission: RE | Admit: 2012-09-16 | Discharge: 2012-09-16 | Disposition: A | Discharge status: Home / Self Care | Payer: Medicare Other | Source: Ambulatory Visit | Attending: Radiation Oncology | Admitting: Radiation Oncology

## 2012-09-17 ENCOUNTER — Ambulatory Visit
Admission: RE | Admit: 2012-09-17 | Discharge: 2012-09-17 | Disposition: A | Discharge status: Home / Self Care | Payer: Medicare Other | Source: Ambulatory Visit | Attending: Radiation Oncology | Admitting: Radiation Oncology

## 2012-09-20 ENCOUNTER — Ambulatory Visit
Admission: RE | Admit: 2012-09-20 | Discharge: 2012-09-20 | Disposition: A | Discharge status: Home / Self Care | Payer: Medicare Other | Source: Ambulatory Visit | Attending: Radiation Oncology | Admitting: Radiation Oncology

## 2012-09-20 VITALS — BP 133/82 | HR 64 | Temp 97.6°F | Wt 199.7 lb

## 2012-09-20 DIAGNOSIS — C61 Malignant neoplasm of prostate: Secondary | ICD-10-CM

## 2012-09-20 NOTE — Progress Notes (Signed)
Weekly Management Note:  Site: Prostate Current Dose:  3900  cGy Projected Dose: 7800  cGy  Narrative: The patient is seen today for routine under treatment assessment. CBCT/MVCT images/port films were reviewed. The chart was reviewed.   Excellent bladder filling today. No new GU or GI difficulties. He is still bothered by hot flashes  Physical Examination:  Filed Vitals:   09/20/12 0926  BP: 133/82  Pulse: 64  Temp: 97.6 F (36.4 C)  .  Weight: 199 lb 11.2 oz (90.583 kg). No change.  Impression: Tolerating radiation therapy well.  Plan: Continue radiation therapy as planned.

## 2012-09-20 NOTE — Progress Notes (Signed)
Weekly assessment for prostate cancer radiation.Completed 20 of 40 treatments.Denies pain or urinary frequency or urgency.Continues to have hot flashes otherwise doing well.

## 2012-09-21 ENCOUNTER — Ambulatory Visit
Admission: RE | Admit: 2012-09-21 | Discharge: 2012-09-21 | Disposition: A | Discharge status: Home / Self Care | Payer: Medicare Other | Source: Ambulatory Visit | Attending: Radiation Oncology | Admitting: Radiation Oncology

## 2012-09-22 ENCOUNTER — Ambulatory Visit
Admission: RE | Admit: 2012-09-22 | Discharge: 2012-09-22 | Disposition: A | Discharge status: Home / Self Care | Payer: Medicare Other | Source: Ambulatory Visit | Attending: Radiation Oncology | Admitting: Radiation Oncology

## 2012-09-23 ENCOUNTER — Ambulatory Visit
Admission: RE | Admit: 2012-09-23 | Discharge: 2012-09-23 | Disposition: A | Discharge status: Home / Self Care | Payer: Medicare Other | Source: Ambulatory Visit | Attending: Radiation Oncology | Admitting: Radiation Oncology

## 2012-09-24 ENCOUNTER — Ambulatory Visit
Admission: RE | Admit: 2012-09-24 | Discharge: 2012-09-24 | Disposition: A | Discharge status: Home / Self Care | Payer: Medicare Other | Source: Ambulatory Visit | Attending: Radiation Oncology | Admitting: Radiation Oncology

## 2012-09-27 ENCOUNTER — Ambulatory Visit
Admission: RE | Admit: 2012-09-27 | Discharge: 2012-09-27 | Disposition: A | Discharge status: Home / Self Care | Payer: Medicare Other | Source: Ambulatory Visit | Attending: Radiation Oncology | Admitting: Radiation Oncology

## 2012-09-27 ENCOUNTER — Encounter: Payer: Self-pay | Admitting: Radiation Oncology

## 2012-09-27 VITALS — BP 125/75 | HR 68 | Temp 97.5°F | Resp 20 | Wt 202.4 lb

## 2012-09-27 DIAGNOSIS — C61 Malignant neoplasm of prostate: Secondary | ICD-10-CM

## 2012-09-27 NOTE — Progress Notes (Signed)
Weekly rad tx prostate,25.40 completed, only urgency to void x1 daily, no dysuria, or nocturia, n=bowels regular  8:59 AM

## 2012-09-27 NOTE — Progress Notes (Signed)
Weekly Management Note:  Site: Prostate Current Dose:  4875  cGy Projected Dose: 7800  cGy  Narrative: The patient is seen today for routine under treatment assessment. CBCT/MVCT images/port films were reviewed. The chart was reviewed.   Excellent bladder filling today. No new GU or GI difficulties.  Physical Examination:  Filed Vitals:   09/27/12 0858  BP: 125/75  Pulse: 68  Temp: 97.5 F (36.4 C)  Resp: 20  .  Weight: 202 lb 6.4 oz (91.808 kg). No change .  Impression: Tolerating radiation therapy well.  Plan: Continue radiation therapy as planned.

## 2012-09-28 ENCOUNTER — Ambulatory Visit
Admission: RE | Admit: 2012-09-28 | Discharge: 2012-09-28 | Disposition: A | Discharge status: Home / Self Care | Payer: Medicare Other | Source: Ambulatory Visit | Attending: Radiation Oncology | Admitting: Radiation Oncology

## 2012-09-29 ENCOUNTER — Ambulatory Visit
Admission: RE | Admit: 2012-09-29 | Discharge: 2012-09-29 | Disposition: A | Discharge status: Home / Self Care | Payer: Medicare Other | Source: Ambulatory Visit | Attending: Radiation Oncology | Admitting: Radiation Oncology

## 2012-09-30 ENCOUNTER — Ambulatory Visit
Admission: RE | Admit: 2012-09-30 | Discharge: 2012-09-30 | Disposition: A | Discharge status: Home / Self Care | Payer: Medicare Other | Source: Ambulatory Visit | Attending: Radiation Oncology | Admitting: Radiation Oncology

## 2012-10-01 ENCOUNTER — Ambulatory Visit
Admission: RE | Admit: 2012-10-01 | Discharge: 2012-10-01 | Disposition: A | Discharge status: Home / Self Care | Payer: Medicare Other | Source: Ambulatory Visit | Attending: Radiation Oncology | Admitting: Radiation Oncology

## 2012-10-04 ENCOUNTER — Ambulatory Visit
Admission: RE | Admit: 2012-10-04 | Discharge: 2012-10-04 | Disposition: A | Discharge status: Home / Self Care | Payer: Medicare Other | Source: Ambulatory Visit | Attending: Radiation Oncology | Admitting: Radiation Oncology

## 2012-10-04 ENCOUNTER — Encounter: Payer: Self-pay | Admitting: Radiation Oncology

## 2012-10-04 VITALS — BP 133/77 | HR 66 | Temp 98.0°F | Resp 20 | Wt 204.3 lb

## 2012-10-04 DIAGNOSIS — C61 Malignant neoplasm of prostate: Secondary | ICD-10-CM

## 2012-10-04 NOTE — Progress Notes (Signed)
Weekly rad txs prostate 30/40 completed, no dysuria, no hesitancy, no bleeding, regular bowel movements now, nocturia 2-3x, eating and drinking well 9:13 AM'

## 2012-10-04 NOTE — Progress Notes (Signed)
Weekly Management Note:  Site: Prostate Current Dose:  5850  cGy Projected Dose: 7800  cGy  Narrative: The patient is seen today for routine under treatment assessment. CBCT/MVCT images/port films were reviewed. The chart was reviewed.   Bladder filling is satisfactory. No new GU or GI difficulties.  Physical Examination:  Filed Vitals:   10/04/12 0910  BP: 133/77  Pulse: 66  Temp: 98 F (36.7 C)  Resp: 20  .  Weight: 204 lb 4.8 oz (92.67 kg). No change.  Impression: Tolerating radiation therapy well.  Plan: Continue radiation therapy as planned.

## 2012-10-05 ENCOUNTER — Ambulatory Visit
Admission: RE | Admit: 2012-10-05 | Discharge: 2012-10-05 | Disposition: A | Discharge status: Home / Self Care | Payer: Medicare Other | Source: Ambulatory Visit | Attending: Radiation Oncology | Admitting: Radiation Oncology

## 2012-10-06 ENCOUNTER — Ambulatory Visit
Admission: RE | Admit: 2012-10-06 | Discharge: 2012-10-06 | Disposition: A | Discharge status: Home / Self Care | Payer: Medicare Other | Source: Ambulatory Visit | Attending: Radiation Oncology | Admitting: Radiation Oncology

## 2012-10-07 ENCOUNTER — Ambulatory Visit
Admission: RE | Admit: 2012-10-07 | Discharge: 2012-10-07 | Disposition: A | Discharge status: Home / Self Care | Payer: Medicare Other | Source: Ambulatory Visit | Attending: Radiation Oncology | Admitting: Radiation Oncology

## 2012-10-08 ENCOUNTER — Ambulatory Visit
Admission: RE | Admit: 2012-10-08 | Discharge: 2012-10-08 | Disposition: A | Discharge status: Home / Self Care | Payer: Medicare Other | Source: Ambulatory Visit | Attending: Radiation Oncology | Admitting: Radiation Oncology

## 2012-10-11 ENCOUNTER — Ambulatory Visit
Admission: RE | Admit: 2012-10-11 | Discharge: 2012-10-11 | Disposition: A | Discharge status: Home / Self Care | Payer: Medicare Other | Source: Ambulatory Visit | Attending: Radiation Oncology | Admitting: Radiation Oncology

## 2012-10-11 DIAGNOSIS — C61 Malignant neoplasm of prostate: Secondary | ICD-10-CM

## 2012-10-11 NOTE — Progress Notes (Signed)
Weekly Management Note:  Site: Prostate Current Dose:  6825  cGy Projected Dose: 7800  cGy  Narrative: The patient is seen today for routine under treatment assessment. CBCT/MVCT images/port films were reviewed. The chart was reviewed.   Bladder filling is excellent. No new GU or GI difficulties  Physical Examination: There were no vitals filed for this visit..  Weight:  . No change.  Impression: Tolerating radiation therapy well.  Plan: Continue radiation therapy as planned.

## 2012-10-11 NOTE — Progress Notes (Signed)
Weekly rad txs 35/40 completed, no dysuria, sometimes urgency,  Up at night 1-2 x,drinks plenty water and good appetite 8:56 AM

## 2012-10-12 ENCOUNTER — Ambulatory Visit
Admission: RE | Admit: 2012-10-12 | Discharge: 2012-10-12 | Disposition: A | Discharge status: Home / Self Care | Payer: Medicare Other | Source: Ambulatory Visit | Attending: Radiation Oncology | Admitting: Radiation Oncology

## 2012-10-13 ENCOUNTER — Ambulatory Visit
Admission: RE | Admit: 2012-10-13 | Discharge: 2012-10-13 | Disposition: A | Discharge status: Home / Self Care | Payer: Medicare Other | Source: Ambulatory Visit | Attending: Radiation Oncology | Admitting: Radiation Oncology

## 2012-10-14 ENCOUNTER — Ambulatory Visit
Admission: RE | Admit: 2012-10-14 | Discharge: 2012-10-14 | Disposition: A | Discharge status: Home / Self Care | Payer: Medicare Other | Source: Ambulatory Visit | Attending: Radiation Oncology | Admitting: Radiation Oncology

## 2012-10-14 DIAGNOSIS — C61 Malignant neoplasm of prostate: Secondary | ICD-10-CM

## 2012-10-14 NOTE — Progress Notes (Signed)
Weekly Management Note:  Site: Prostate Current Dose:  7410  cGy Projected Dose: 7800  cGy  Narrative: The patient is seen today for routine under treatment assessment. CBCT/MVCT images/port films were reviewed. The chart was reviewed.   Bladder filling is satisfactory. No new GU or GI difficulties.  Physical Examination: There were no vitals filed for this visit..  Weight:  . No change.  Impression: Tolerating radiation therapy well.  Plan: Continue radiation therapy as planned.

## 2012-10-15 ENCOUNTER — Ambulatory Visit
Admission: RE | Admit: 2012-10-15 | Discharge: 2012-10-15 | Disposition: A | Discharge status: Home / Self Care | Payer: Medicare Other | Source: Ambulatory Visit | Attending: Radiation Oncology | Admitting: Radiation Oncology

## 2012-10-18 ENCOUNTER — Ambulatory Visit
Admission: RE | Admit: 2012-10-18 | Discharge: 2012-10-18 | Disposition: A | Discharge status: Home / Self Care | Payer: Medicare Other | Source: Ambulatory Visit | Attending: Radiation Oncology | Admitting: Radiation Oncology

## 2012-10-24 ENCOUNTER — Encounter: Payer: Self-pay | Admitting: Radiation Oncology

## 2012-10-24 NOTE — Progress Notes (Signed)
[facility] Radiation Oncology End of Treatment Note  Name:Matthew Wilkerson  Date: 10/24/2012 ZOX:[id number] DOB:July 19, 1945   Status:outpatient    CC: Lucilla Edin, MD  Dr. Barron Alvine  REFERRING PHYSICIAN:   Dr. Barron Alvine   DIAGNOSIS: Stage TI C. intermediate risk adenocarcinoma prostate    INDICATION FOR TREATMENT: Curative   TREATMENT DATES: 08/23/2012 through 10/18/2012                          SITE/DOSE:    Prostate, 7800 cGy in 40 sessions, seminal vesicles 5600 cGy in 40 sessions                        BEAMS/ENERGY:      6 MV photons dual ARC VMAT IMRT.             NARRATIVE:  [name] tolerated his treatment well with no significant GU or GI difficulty during his course of therapy.                          PLAN: Routine followup in one month. Patient instructed to call if questions or worsening complaints in interim.

## 2012-11-03 ENCOUNTER — Ambulatory Visit (INDEPENDENT_AMBULATORY_CARE_PROVIDER_SITE_OTHER): Payer: Medicare Other | Admitting: Family Medicine

## 2012-11-03 VITALS — BP 120/84 | HR 86 | Temp 98.0°F | Resp 16 | Ht 68.5 in | Wt 199.0 lb

## 2012-11-03 DIAGNOSIS — L0291 Cutaneous abscess, unspecified: Secondary | ICD-10-CM

## 2012-11-03 DIAGNOSIS — L0390 Cellulitis, unspecified: Secondary | ICD-10-CM

## 2012-11-03 MED ORDER — DOXYCYCLINE HYCLATE 100 MG PO TABS: 100.0000 mg | ORAL_TABLET | Freq: Two times a day (BID) | ORAL | Status: DC

## 2012-11-03 NOTE — Patient Instructions (Addendum)
Cellulitis Cellulitis is an infection of the skin and the tissue beneath it. The infected area is usually red and tender. Cellulitis occurs most often in the arms and lower legs.  CAUSES  Cellulitis is caused by bacteria that enter the skin through cracks or cuts in the skin. The most common types of bacteria that cause cellulitis are Staphylococcus and Streptococcus. SYMPTOMS   Redness and warmth.  Swelling.  Tenderness or pain.  Fever. DIAGNOSIS  Your caregiver can usually determine what is wrong based on a physical exam. Blood tests may also be done. TREATMENT  Treatment usually involves taking an antibiotic medicine. HOME CARE INSTRUCTIONS   Take your antibiotics as directed. Finish them even if you start to feel better.  Keep the infected arm or leg elevated to reduce swelling.  Apply a warm cloth to the affected area up to 4 times per day to relieve pain.  Only take over-the-counter or prescription medicines for pain, discomfort, or fever as directed by your caregiver.  Keep all follow-up appointments as directed by your caregiver. SEEK MEDICAL CARE IF:   You notice red streaks coming from the infected area.  Your red area gets larger or turns dark in color.  Your bone or joint underneath the infected area becomes painful after the skin has healed.  Your infection returns in the same area or another area.  You notice a swollen bump in the infected area.  You develop new symptoms. SEEK IMMEDIATE MEDICAL CARE IF:   You have a fever.  You feel very sleepy.  You develop vomiting or diarrhea.  You have a general ill feeling (malaise) with muscle aches and pains. MAKE SURE YOU:   Understand these instructions.  Will watch your condition.  Will get help right away if you are not doing well or get worse. Document Released: 01/15/2005 Document Revised: 10/07/2011 Document Reviewed: 06/23/2011 [employer] Patient Information 2014 [employer].  

## 2012-11-03 NOTE — Progress Notes (Signed)
67 yo who completed radiation treatments for prostate cancer 16 days ago.  He has developed a rash on medial left ankle which is spreading over the past 5 days.  It began as a little bump that was itching and it continues to enlarge as well as seed other locations around the ankle.  No fever  Objective:  NAD Two large areas of erythema and induration, one about 5 cm on medial malleolus and the other about 3 cm on anterior left ankle. Associated 1+ pretibial and ankle edema  Assessment:  Cellulitis  Plan:

## 2012-11-09 DIAGNOSIS — M7989 Other specified soft tissue disorders: Secondary | ICD-10-CM

## 2012-11-09 DIAGNOSIS — M79609 Pain in unspecified limb: Secondary | ICD-10-CM

## 2012-11-10 ENCOUNTER — Ambulatory Visit (INDEPENDENT_AMBULATORY_CARE_PROVIDER_SITE_OTHER): Payer: Medicare Other | Admitting: Emergency Medicine

## 2012-11-10 ENCOUNTER — Ambulatory Visit (HOSPITAL_COMMUNITY)
Admission: RE | Admit: 2012-11-10 | Discharge: 2012-11-10 | Disposition: A | Discharge status: Home / Self Care | Payer: Medicare Other | Source: Ambulatory Visit | Attending: Emergency Medicine | Admitting: Emergency Medicine

## 2012-11-10 VITALS — BP 128/76 | HR 78 | Temp 98.0°F | Resp 16 | Ht 67.0 in | Wt 198.0 lb

## 2012-11-10 DIAGNOSIS — M79609 Pain in unspecified limb: Secondary | ICD-10-CM | POA: Insufficient documentation

## 2012-11-10 DIAGNOSIS — M7989 Other specified soft tissue disorders: Secondary | ICD-10-CM

## 2012-11-10 DIAGNOSIS — C61 Malignant neoplasm of prostate: Secondary | ICD-10-CM | POA: Insufficient documentation

## 2012-11-10 DIAGNOSIS — L0390 Cellulitis, unspecified: Secondary | ICD-10-CM | POA: Insufficient documentation

## 2012-11-10 DIAGNOSIS — L0291 Cutaneous abscess, unspecified: Secondary | ICD-10-CM | POA: Insufficient documentation

## 2012-11-10 DIAGNOSIS — L03116 Cellulitis of left lower limb: Secondary | ICD-10-CM

## 2012-11-10 DIAGNOSIS — L03119 Cellulitis of unspecified part of limb: Secondary | ICD-10-CM

## 2012-11-10 LAB — POCT CBC
Granulocyte percent: 73.8 %G (ref 37–80)
HCT, POC: 37.1 % — AB (ref 43.5–53.7)
Hemoglobin: 11.5 g/dL — AB (ref 14.1–18.1)
POC Granulocyte: 5.2 (ref 2–6.9)
RBC: 3.66 M/uL — AB (ref 4.69–6.13)

## 2012-11-10 MED ORDER — CLINDAMYCIN HCL 300 MG PO CAPS: 300.0000 mg | ORAL_CAPSULE | Freq: Three times a day (TID) | ORAL | Status: DC

## 2012-11-10 NOTE — Progress Notes (Signed)
VASCULAR LAB PRELIMINARY  PRELIMINARY  PRELIMINARY  PRELIMINARY  Left lower extremity venous duplex completed.    Preliminary report:  Left:  No evidence of DVT, superficial thrombosis, or Baker's cyst.  Sarah Baez, RVS 11/10/2012, 12:52 PM

## 2012-11-10 NOTE — Progress Notes (Signed)
  Subjective:    Patient ID: Matthew Wilkerson, male    DOB: 1945-12-27, 67 y.o.   MRN: [id number]  HPI 67 year old male presents with left lower leg redness and swelling.  Here for recheck of left lower leg, swelling and redness Came in 7/16 for rash on lower left leg.  Given doxycycline.  Taking it twice a day.  Redness and swelling is not worse since 7/16, redness is going away but ankle still swollen Patient does not feel sick.        Review of Systems     Objective:   Physical Exam there is redness over the entire left shin. The skin in this area is very shiny. There is no definite calf tenderness.  Results for orders placed in visit on 11/10/12  POCT CBC      Result Value Range   WBC 7.0  4.6 - 10.2 K/uL   Lymph, poc 1.3  0.6 - 3.4   POC LYMPH PERCENT 18.2  10 - 50 %L   MID (cbc) 0.6  0 - 0.9   POC MID % 8.0  0 - 12 %M   POC Granulocyte 5.2  2 - 6.9   Granulocyte percent 73.8  37 - 80 %G   RBC 3.66 (*) 4.69 - 6.13 M/uL   Hemoglobin 11.5 (*) 14.1 - 18.1 g/dL   HCT, POC 40.9 (*) 81.1 - 53.7 %   MCV 101.5 (*) 80 - 97 fL   MCH, POC 31.4 (*) 27 - 31.2 pg   MCHC 31.0 (*) 31.8 - 35.4 g/dL   RDW, POC 91.4     Platelet Count, POC 167  142 - 424 K/uL   MPV 8.7  0 - 99.8 fL        Assessment & Plan:  Patient on doxy for one week with persistent redness and swelling in the left shin. We'll change to Cleocin 300  3  times a day for 10 days. Stop the doxycycline. check an ultrasound of the left calf because he is at risk for clot due to his underlying prostate cancer.

## 2012-11-10 NOTE — Patient Instructions (Addendum)
Cellulitis Cellulitis is an infection of the skin and the tissue beneath it. The infected area is usually red and tender. Cellulitis occurs most often in the arms and lower legs.  CAUSES  Cellulitis is caused by bacteria that enter the skin through cracks or cuts in the skin. The most common types of bacteria that cause cellulitis are Staphylococcus and Streptococcus. SYMPTOMS   Redness and warmth.  Swelling.  Tenderness or pain.  Fever. DIAGNOSIS  Your caregiver can usually determine what is wrong based on a physical exam. Blood tests may also be done. TREATMENT  Treatment usually involves taking an antibiotic medicine. HOME CARE INSTRUCTIONS   Take your antibiotics as directed. Finish them even if you start to feel better.  Keep the infected arm or leg elevated to reduce swelling.  Apply a warm cloth to the affected area up to 4 times per day to relieve pain.  Only take over-the-counter or prescription medicines for pain, discomfort, or fever as directed by your caregiver.  Keep all follow-up appointments as directed by your caregiver. SEEK MEDICAL CARE IF:   You notice red streaks coming from the infected area.  Your red area gets larger or turns dark in color.  Your bone or joint underneath the infected area becomes painful after the skin has healed.  Your infection returns in the same area or another area.  You notice a swollen bump in the infected area.  You develop new symptoms. SEEK IMMEDIATE MEDICAL CARE IF:   You have a fever.  You feel very sleepy.  You develop vomiting or diarrhea.  You have a general ill feeling (malaise) with muscle aches and pains. MAKE SURE YOU:   Understand these instructions.  Will watch your condition.  Will get help right away if you are not doing well or get worse. Document Released: 01/15/2005 Document Revised: 10/07/2011 Document Reviewed: 06/23/2011 [employer] Patient Information 2014 [employer].  

## 2012-11-12 ENCOUNTER — Encounter: Payer: Self-pay | Admitting: Radiation Oncology

## 2012-11-15 ENCOUNTER — Telehealth: Payer: Self-pay

## 2012-11-15 NOTE — Telephone Encounter (Signed)
Pt was in our office last Wednesday and sent for an ultrasound of his legs, and would like a copy of those results Mailed to the home address on file in epic.  Best number [phone]

## 2012-11-16 ENCOUNTER — Encounter: Payer: Self-pay | Admitting: Radiation Oncology

## 2012-11-16 ENCOUNTER — Ambulatory Visit
Admission: RE | Admit: 2012-11-16 | Discharge: 2012-11-16 | Disposition: A | Discharge status: Home / Self Care | Payer: Medicare Other | Source: Ambulatory Visit | Attending: Radiation Oncology | Admitting: Radiation Oncology

## 2012-11-16 VITALS — BP 133/82 | HR 79 | Temp 98.2°F | Resp 16 | Wt 204.3 lb

## 2012-11-16 DIAGNOSIS — C61 Malignant neoplasm of prostate: Secondary | ICD-10-CM

## 2012-11-16 HISTORY — DX: Personal history of irradiation: Z92.3

## 2012-11-16 NOTE — Progress Notes (Signed)
Reports frequent hot flashes related to effect of lupron injection last received in April. Reports diarrhea associated with clindamycin use. Reports two loose bowel movements per day assured patient diarrhea should resolve once he completes antibiotic course. Patient being treated by PCP for cellulitis of left lower leg. Reports on average he voids twice during the night. Denies dysuria or hematuria. Reports only occasional difficulty emptying his bladder. Denies a weak urine stream but, reports "it isn't as strong as it once was."

## 2012-11-16 NOTE — Progress Notes (Signed)
CC: Dr. Barron Alvine, Dr. Earl Lites  Followup note: Matthew Wilkerson returns today approximately 1 month following completion of external beam/IMRT in the management of his stage TI C. intermediate risk adenocarcinoma prostate. He is doing well from a GU and GI standpoint. He does have nocturia x2. He is back to his baseline urinary habits. He occasionally has loose bowels for which he takes Imodium. He does eat a fair amount of raw vegetables. He was recently placed on antibiotics for cellulitis of his left lower leg.  Physical examination: Alert oriented. Wt Readings from Last 3 Encounters:  11/16/12 204 lb 4.8 oz (92.67 kg)  11/10/12 198 lb (89.812 kg)  11/03/12 199 lb (90.266 kg)   Temp Readings from Last 3 Encounters:  11/16/12 98.2 F (36.8 C) Oral  11/10/12 98 F (36.7 C) Oral  11/03/12 98 F (36.7 C) Oral   BP Readings from Last 3 Encounters:  11/16/12 133/82  11/10/12 128/76  11/03/12 120/84   Pulse Readings from Last 3 Encounters:  11/16/12 79  11/10/12 78  11/03/12 86   Rectal examination not performed today.  Impression: Satisfactory progress.   Plan: He'll see Dr. Isabel Caprice for a followup visit on August 25 after blood work on August 18. I've not scheduled the patient for a formal followup visit and I ask that Dr. Isabel Caprice keep me posted on his progress.

## 2012-11-17 ENCOUNTER — Ambulatory Visit (INDEPENDENT_AMBULATORY_CARE_PROVIDER_SITE_OTHER): Payer: Medicare Other | Admitting: Emergency Medicine

## 2012-11-17 VITALS — BP 120/88 | HR 86 | Temp 98.0°F | Resp 16 | Ht 68.0 in | Wt 202.0 lb

## 2012-11-17 DIAGNOSIS — M7989 Other specified soft tissue disorders: Secondary | ICD-10-CM

## 2012-11-17 DIAGNOSIS — L03116 Cellulitis of left lower limb: Secondary | ICD-10-CM

## 2012-11-17 DIAGNOSIS — L03119 Cellulitis of unspecified part of limb: Secondary | ICD-10-CM

## 2012-11-17 MED ORDER — CLINDAMYCIN HCL 300 MG PO CAPS: 300.0000 mg | ORAL_CAPSULE | Freq: Four times a day (QID) | ORAL | Status: DC

## 2012-11-17 NOTE — Patient Instructions (Addendum)
Edema Edema is an abnormal build-up of fluids in tissues. Because this is partly dependent on gravity (water flows to the lowest place), it is more common in the legs and thighs (lower extremities). It is also common in the looser tissues, like around the eyes. Painless swelling of the feet and ankles is common and increases as a person ages. It may affect both legs and may include the calves or even thighs. When squeezed, the fluid may move out of the affected area and may leave a dent for a few moments. CAUSES   Prolonged standing or sitting in one place for extended periods of time. Movement helps pump tissue fluid into the veins, and absence of movement prevents this, resulting in edema.  Varicose veins. The valves in the veins do not work as well as they should. This causes fluid to leak into the tissues.  Fluid and salt overload.  Injury, burn, or surgery to the leg, ankle, or foot, may damage veins and allow fluid to leak out.  Sunburn damages vessels. Leaky vessels allow fluid to go out into the sunburned tissues.  Allergies (from insect bites or stings, medications or chemicals) cause swelling by allowing vessels to become leaky.  Protein in the blood helps keep fluid in your vessels. Low protein, as in malnutrition, allows fluid to leak out.  Hormonal changes, including pregnancy and menstruation, cause fluid retention. This fluid may leak out of vessels and cause edema.  Medications that cause fluid retention. Examples are sex hormones, blood pressure medications, steroid treatment, or anti-depressants.  Some illnesses cause edema, especially heart failure, kidney disease, or liver disease.  Surgery that cuts veins or lymph nodes, such as surgery done for the heart or for breast cancer, may result in edema. DIAGNOSIS  Your caregiver is usually easily able to determine what is causing your swelling (edema) by simply asking what is wrong (getting a history) and examining you (doing  a physical). Sometimes x-rays, EKG (electrocardiogram or heart tracing), and blood work may be done to evaluate for underlying medical illness. TREATMENT  General treatment includes:  Leg elevation (or elevation of the affected body part).  Restriction of fluid intake.  Prevention of fluid overload.  Compression of the affected body part. Compression with elastic bandages or support stockings squeezes the tissues, preventing fluid from entering and forcing it back into the blood vessels.  Diuretics (also called water pills or fluid pills) pull fluid out of your body in the form of increased urination. These are effective in reducing the swelling, but can have side effects and must be used only under your caregiver's supervision. Diuretics are appropriate only for some types of edema. The specific treatment can be directed at any underlying causes discovered. Heart, liver, or kidney disease should be treated appropriately. HOME CARE INSTRUCTIONS   Elevate the legs (or affected body part) above the level of the heart, while lying down.  Avoid sitting or standing still for prolonged periods of time.  Avoid putting anything directly under the knees when lying down, and do not wear constricting clothing or garters on the upper legs.  Exercising the legs causes the fluid to work back into the veins and lymphatic channels. This may help the swelling go down.  The pressure applied by elastic bandages or support stockings can help reduce ankle swelling.  A low-salt diet may help reduce fluid retention and decrease the ankle swelling.  Take any medications exactly as prescribed. SEEK MEDICAL CARE IF:  Your edema is   not responding to recommended treatments. SEEK IMMEDIATE MEDICAL CARE IF:   You develop shortness of breath or chest pain.  You cannot breathe when you lay down; or if, while lying down, you have to get up and go to the window to get your breath.  You are having increasing  swelling without relief from treatment.  You develop a fever over 102 F (38.9 C).  You develop pain or redness in the areas that are swollen.  Tell your caregiver right away if you have gained 3 lb/1.4 kg in 1 day or 5 lb/2.3 kg in a week. MAKE SURE YOU:   Understand these instructions.  Will watch your condition.  Will get help right away if you are not doing well or get worse. Document Released: 04/07/2005 Document Revised: 10/07/2011 Document Reviewed: 11/24/2007 [facility] Patient Information 2014 [place], [place]. Cellulitis Cellulitis is an infection of the skin and the tissue beneath it. The infected area is usually red and tender. Cellulitis occurs most often in the arms and lower legs.  CAUSES  Cellulitis is caused by bacteria that enter the skin through cracks or cuts in the skin. The most common types of bacteria that cause cellulitis are Staphylococcus and Streptococcus. SYMPTOMS   Redness and warmth.  Swelling.  Tenderness or pain.  Fever. DIAGNOSIS  Your caregiver can usually determine what is wrong based on a physical exam. Blood tests may also be done. TREATMENT  Treatment usually involves taking an antibiotic medicine. HOME CARE INSTRUCTIONS   Take your antibiotics as directed. Finish them even if you start to feel better.  Keep the infected arm or leg elevated to reduce swelling.  Apply a warm cloth to the affected area up to 4 times per day to relieve pain.  Only take over-the-counter or prescription medicines for pain, discomfort, or fever as directed by your caregiver.  Keep all follow-up appointments as directed by your caregiver. SEEK MEDICAL CARE IF:   You notice red streaks coming from the infected area.  Your red area gets larger or turns dark in color.  Your bone or joint underneath the infected area becomes painful after the skin has healed.  Your infection returns in the same area or another area.  You notice a swollen bump in the  infected area.  You develop new symptoms. SEEK IMMEDIATE MEDICAL CARE IF:   You have a fever.  You feel very sleepy.  You develop vomiting or diarrhea.  You have a general ill feeling (malaise) with muscle aches and pains. MAKE SURE YOU:   Understand these instructions.  Will watch your condition.  Will get help right away if you are not doing well or get worse. Document Released: 01/15/2005 Document Revised: 10/07/2011 Document Reviewed: 06/23/2011 [facility] Patient Information 2014 [place], [place].

## 2012-11-17 NOTE — Telephone Encounter (Signed)
Mailed

## 2012-11-17 NOTE — Progress Notes (Signed)
  Subjective:    Patient ID: Matthew Wilkerson, male    DOB: 1946-02-22, 67 y.o.   MRN: [id number]  HPI Pt here for follow up of left leg. He is still taking the Cleocin. He has 9 pills left. He says he feels like it is improving, but his ankle has some edema in it now as well. He also has an area of redness on his distal right leg that he is concerned about.  He does feel like it is some better on the Cleocin. The area of his right leg that is now red is new. His Doppler study that was done one week ago was completely normal. He does not feel systemically ill he denies headache fever or chills nausea vomiting or any other systemic symptoms  Review of Systems     Objective:   Physical Exam there is a 1 x 2 cm irregular red area on the right lateral leg. Examination of the left leg reveals 2+ edema which extends from the ankle to the knee. The skin in this area is shiny. Dorsalis pedis pulse on the left is 2+. Sensation is intact. Circumference of the calf measured 5 inches below the inferior border of patella is 3/4 inches greater on the left compared to the right there is no posterior calf tenderness        Assessment & Plan:  Patient here with a cellulitis of the left leg. He also has a small patch on the right leg which is new. There does appear to be an advancing border proximal to the swelling on his left leg.l increase his Cleocin to 4 times a day recheck in one week will need a repeat ultrasound if not improving.

## 2012-11-24 ENCOUNTER — Ambulatory Visit (INDEPENDENT_AMBULATORY_CARE_PROVIDER_SITE_OTHER): Payer: Medicare Other | Admitting: Emergency Medicine

## 2012-11-24 VITALS — BP 122/74 | HR 75 | Temp 98.0°F | Resp 16 | Ht 68.0 in | Wt 202.0 lb

## 2012-11-24 DIAGNOSIS — L03116 Cellulitis of left lower limb: Secondary | ICD-10-CM

## 2012-11-24 DIAGNOSIS — L02419 Cutaneous abscess of limb, unspecified: Secondary | ICD-10-CM

## 2012-11-24 NOTE — Progress Notes (Signed)
  Subjective:    Patient ID: Matthew Wilkerson, male    DOB: 12/30/1945, 67 y.o.   MRN: [id number]  HPI patient enters for recheck of a cellulitis of his left leg. He was changed from doxycycline to clindamycin and with this changed his legs felt better. He has had decreased swelling and has noted some peeling of the skin. He feels fine he is not sick he has no fevers chills is eating normally    Review of Systems     Objective:   Physical Exam the area of redness on the right leg has resolved. The left leg continues to be swollen but it has decreased in swelling. There is peeling of the skin over the lower ankle. There is no calf tenderness.        Assessment & Plan:  Patient with an aggressive cellulitis involving the left calf. He is to continue Cleocin for now. Advised him to get probiotics to take to prevent diarrhea associated with the Cleocin. He is to follow up with me on Monday so I can arrange outpatient followup while I am not available.

## 2013-01-06 ENCOUNTER — Ambulatory Visit (INDEPENDENT_AMBULATORY_CARE_PROVIDER_SITE_OTHER): Payer: Medicare Other | Admitting: Emergency Medicine

## 2013-01-06 VITALS — BP 110/70 | HR 74 | Temp 98.0°F | Resp 16 | Ht 67.0 in | Wt 202.0 lb

## 2013-01-06 DIAGNOSIS — R21 Rash and other nonspecific skin eruption: Secondary | ICD-10-CM

## 2013-01-06 DIAGNOSIS — L0390 Cellulitis, unspecified: Secondary | ICD-10-CM

## 2013-01-06 DIAGNOSIS — L0291 Cutaneous abscess, unspecified: Secondary | ICD-10-CM

## 2013-01-06 LAB — POCT CBC
Lymph, poc: 1.5 (ref 0.6–3.4)
MCH, POC: 31.7 pg — AB (ref 27–31.2)
MCHC: 30.9 g/dL — AB (ref 31.8–35.4)
MCV: 102.5 fL — AB (ref 80–97)
MID (cbc): 0.6 (ref 0–0.9)
POC LYMPH PERCENT: 27.8 %L (ref 10–50)
Platelet Count, POC: 148 10*3/uL (ref 142–424)
RBC: 3.6 M/uL — AB (ref 4.69–6.13)
RDW, POC: 14.5 %
WBC: 5.4 10*3/uL (ref 4.6–10.2)

## 2013-01-06 LAB — POCT SKIN KOH: Skin KOH, POC: NEGATIVE

## 2013-01-06 MED ORDER — CLOTRIMAZOLE-BETAMETHASONE 1-0.05 % EX CREA: TOPICAL_CREAM | Freq: Two times a day (BID) | CUTANEOUS | Status: DC

## 2013-01-06 NOTE — Progress Notes (Signed)
Subjective:    Patient ID: Matthew Wilkerson, male    DOB: [birth year], 66 y.o.   MRN: [id number]  HPI 67 year old patient presents for a follow up on a rash on his left lower leg. He states that the swelling has decreased, but the rash is still present. He states that the skin feels tight around the rash. The rash has been present for about 8 weeks. Patient currently receives radiation treatments and states that oncologist told him that this rash did not come from radiation.    Review of Systems     Objective:   Physical Exam on the inside of the left leg is a 4 x 4 inch red area which is not very tender. There is some swelling in the adjacent tissue around this area. The area of rash is superior to the medial malleolus.  Results for orders placed in visit on 01/06/13  POCT SKIN KOH      Result Value Range   Skin KOH, POC Negative    POCT CBC      Result Value Range   WBC 5.4  4.6 - 10.2 K/uL   Lymph, poc 1.5  0.6 - 3.4   POC LYMPH PERCENT 27.8  10 - 50 %L   MID (cbc) 0.6  0 - 0.9   POC MID % 6.3  0 - 12 %M   POC Granulocyte 3.6  2 - 6.9   Granulocyte percent 65.9  37 - 80 %G   RBC 3.60 (*) 4.69 - 6.13 M/uL   Hemoglobin 11.4 (*) 14.1 - 18.1 g/dL   HCT, POC 40.9 (*) 81.1 - 53.7 %   MCV 102.5 (*) 80 - 97 fL   MCH, POC 31.7 (*) 27 - 31.2 pg   MCHC 30.9 (*) 31.8 - 35.4 g/dL   RDW, POC 91.4     Platelet Count, POC 148  142 - 424 K/uL   MPV 8.5  0 - 99.8 fL   Results for orders placed in visit on 01/06/13  POCT SKIN KOH      Result Value Range   Skin KOH, POC Negative    POCT CBC      Result Value Range   WBC 5.4  4.6 - 10.2 K/uL   Lymph, poc 1.5  0.6 - 3.4   POC LYMPH PERCENT 27.8  10 - 50 %L   MID (cbc) 0.6  0 - 0.9   POC MID % 6.3  0 - 12 %M   POC Granulocyte 3.6  2 - 6.9   Granulocyte percent 65.9  37 - 80 %G   RBC 3.60 (*) 4.69 - 6.13 M/uL   Hemoglobin 11.4 (*) 14.1 - 18.1 g/dL   HCT, POC 78.2 (*) 95.6 - 53.7 %   MCV 102.5 (*) 80 - 97 fL   MCH, POC 31.7 (*) 27 -  31.2 pg   MCHC 30.9 (*) 31.8 - 35.4 g/dL   RDW, POC 21.3     Platelet Count, POC 148  142 - 424 K/uL   MPV 8.5  0 - 99.8 fL  POCT SEDIMENTATION RATE      Result Value Range   POCT SED RATE 53 (*) 0 - 22 mm/hr       Assessment & Plan:  White count is normal. We'll do a fungal culture. There were a few suspicious areas on KOH but not definite for hyphae. We'll place on lotrisone  cream to the area 3 times a day an  appointment made for ID. his sedimentation rate is elevated at 53 again raising the question of a cellulitis and I would appreciate ID recommendations. A fungal culture was also sent .

## 2013-01-07 ENCOUNTER — Other Ambulatory Visit: Payer: Self-pay | Admitting: Emergency Medicine

## 2013-01-07 ENCOUNTER — Telehealth: Payer: Self-pay | Admitting: Radiology

## 2013-01-07 DIAGNOSIS — L03116 Cellulitis of left lower limb: Secondary | ICD-10-CM

## 2013-01-07 MED ORDER — CLINDAMYCIN HCL 300 MG PO CAPS: 300.0000 mg | ORAL_CAPSULE | Freq: Four times a day (QID) | ORAL | Status: DC

## 2013-01-07 NOTE — Telephone Encounter (Signed)
Left message for ID clinic for call back to advise on scheduling, do you want to page doctor on call?

## 2013-01-07 NOTE — Telephone Encounter (Signed)
Thanks. Patient advised.  

## 2013-01-07 NOTE — Telephone Encounter (Signed)
Appointment being made to ID clinic. We'll restart Cleocin 300 mg 3 times a day for 10 days. Patient to see me after he completes ten days. I will call in the medication for him. Please call the patient and advise him that I need to see him after 10 days of treatment. Matthew Wilkerson is working on the appointment so we do not have to page the ID doctor at the present time

## 2013-01-24 ENCOUNTER — Other Ambulatory Visit (HOSPITAL_COMMUNITY)
Admission: RE | Admit: 2013-01-24 | Discharge: 2013-01-24 | Disposition: A | Discharge status: Home / Self Care | Payer: Medicare Other | Source: Ambulatory Visit | Attending: Infectious Disease | Admitting: Infectious Disease

## 2013-01-24 ENCOUNTER — Ambulatory Visit (INDEPENDENT_AMBULATORY_CARE_PROVIDER_SITE_OTHER): Payer: Medicare Other | Admitting: Infectious Disease

## 2013-01-24 ENCOUNTER — Encounter: Payer: Self-pay | Admitting: Infectious Disease

## 2013-01-24 VITALS — BP 132/85 | HR 77 | Temp 97.7°F | Ht 68.0 in | Wt 205.0 lb

## 2013-01-24 DIAGNOSIS — R609 Edema, unspecified: Secondary | ICD-10-CM

## 2013-01-24 DIAGNOSIS — R21 Rash and other nonspecific skin eruption: Secondary | ICD-10-CM

## 2013-01-24 DIAGNOSIS — L02419 Cutaneous abscess of limb, unspecified: Secondary | ICD-10-CM

## 2013-01-24 DIAGNOSIS — L03116 Cellulitis of left lower limb: Secondary | ICD-10-CM

## 2013-01-24 DIAGNOSIS — C61 Malignant neoplasm of prostate: Secondary | ICD-10-CM

## 2013-01-24 DIAGNOSIS — L0291 Cutaneous abscess, unspecified: Secondary | ICD-10-CM

## 2013-01-24 DIAGNOSIS — L0390 Cellulitis, unspecified: Secondary | ICD-10-CM

## 2013-01-24 LAB — COMPLETE METABOLIC PANEL WITH GFR
CO2: 28 mEq/L (ref 19–32)
GFR, Est African American: 61 mL/min
GFR, Est Non African American: 53 mL/min — ABNORMAL LOW
Glucose, Bld: 92 mg/dL (ref 70–99)
Sodium: 142 mEq/L (ref 135–145)
Total Bilirubin: 0.4 mg/dL (ref 0.3–1.2)
Total Protein: 7.7 g/dL (ref 6.0–8.3)

## 2013-01-24 LAB — CBC WITH DIFFERENTIAL/PLATELET
Basophils Relative: 0 % (ref 0–1)
Lymphocytes Relative: 20 % (ref 12–46)
Lymphs Abs: 1.1 10*3/uL (ref 0.7–4.0)
MCH: 31.9 pg (ref 26.0–34.0)
MCV: 93.4 fL (ref 78.0–100.0)
Monocytes Absolute: 0.5 10*3/uL (ref 0.1–1.0)
Monocytes Relative: 9 % (ref 3–12)
Neutro Abs: 3.8 10*3/uL (ref 1.7–7.7)
Neutrophils Relative %: 68 % (ref 43–77)
WBC: 5.5 10*3/uL (ref 4.0–10.5)

## 2013-01-24 LAB — HIV ANTIBODY (ROUTINE TESTING W REFLEX): HIV: NONREACTIVE

## 2013-01-24 MED ORDER — CLINDAMYCIN HCL 300 MG PO CAPS: 300.0000 mg | ORAL_CAPSULE | Freq: Four times a day (QID) | ORAL | Status: DC

## 2013-01-24 MED ORDER — CLOTRIMAZOLE 1 % EX CREA: TOPICAL_CREAM | Freq: Two times a day (BID) | CUTANEOUS | Status: DC

## 2013-01-24 NOTE — Progress Notes (Signed)
Subjective:    Patient ID: Matthew Wilkerson, male    DOB: Oct 01, 1945, 67 y.o.   MRN: [id number]  HPI  67 year old African American man who has been diagnosed with prostate cancer given injection of what I believe to be chemical and antigen therapy by his urologist also receiving radiation therapy in last several months. He had an area on his ankle that apparently had been scratched by a piece of vegetation in his yard and became pruritic. He scratched this area and then afterwards developed a rash this persisted for last several months.  Was initially given a prescription for doxycycline which failed to improve any of his rash whatsoever. He is then given clindamycin she has had several courses of which seems to possibly have made a difference in terms of his rash possibly receding slightly. More recently he had a KOH prep done and was placed on combination corticosteroid and clotrimazole topical cream with resumption of his clindamycin. Again the area of the rash recessed slightly but has persisted as has his chronic edema.  We are asked to see the patient to help assist in the workup and management of his rash in particular to make sure that the patient does not have a deeper infection with an indolent organism such as Nocardia, nontuberculous mycobacteria or endemic mycoses.  He does have fairly extensive travel history and has even been to the [facility] but not prior to the onset of these lesions. He is now on immunosuppressive drugs whatsoever this point time.  PROCEDURE: Risks and benefits of the procedure were explained to the patient and informed consent was signed and is available the patient's paper chart.  Description: Left leg was prepared in the usual sterile fashion with antiseptic. Anesthesia was obtained by topical anesthetic spray followed by infiltration of the skin with 1% lidocaine at 2 different sites. Anesthesia was obtained. Punch biopsy was performed on 2 areas of the rash  that had been anesthetized. There was minimal bleeding slightly more so than the second biopsy the first biopsy site. Bleeding was stopped using chemical cauterization and bandage applied.  Complications none  Review of Systems  Constitutional: Negative for fever, chills, diaphoresis, activity change, appetite change, fatigue and unexpected weight change.  HENT: Negative for congestion, sore throat, rhinorrhea, sneezing, trouble swallowing and sinus pressure.   Eyes: Negative for photophobia and visual disturbance.  Respiratory: Negative for cough, chest tightness, shortness of breath, wheezing and stridor.   Cardiovascular: Negative for chest pain, palpitations and leg swelling.  Gastrointestinal: Negative for nausea, vomiting, abdominal pain, diarrhea, constipation, blood in stool, abdominal distention and anal bleeding.  Genitourinary: Negative for dysuria, hematuria, flank pain and difficulty urinating.  Musculoskeletal: Negative for myalgias, back pain, joint swelling, arthralgias and gait problem.  Skin: Positive for rash. Negative for color change, pallor and wound.  Neurological: Negative for dizziness, tremors, weakness and light-headedness.  Hematological: Negative for adenopathy. Does not bruise/bleed easily.  Psychiatric/Behavioral: Negative for behavioral problems, confusion, sleep disturbance, dysphoric mood, decreased concentration and agitation.       Objective:   Physical Exam  Constitutional: He is oriented to person, place, and time. He appears well-developed and well-nourished. No distress.  HENT:  Head: Normocephalic and atraumatic.  Mouth/Throat: Oropharynx is clear and moist. No oropharyngeal exudate.  Eyes: Conjunctivae and EOM are normal. Pupils are equal, round, and reactive to light. No scleral icterus.  Neck: Normal range of motion. Neck supple. No JVD present.  Cardiovascular: Normal rate, regular rhythm and normal heart sounds.  Exam reveals no gallop and no  friction rub.   No murmur heard. Pulmonary/Chest: Effort normal and breath sounds normal. No respiratory distress. He has no wheezes. He has no rales. He exhibits no tenderness.  Abdominal: He exhibits no distension and no mass. There is no tenderness. There is no rebound and no guarding.  Musculoskeletal: He exhibits no edema and no tenderness.       Legs: Lymphadenopathy:    He has no cervical adenopathy.  Neurological: He is alert and oriented to person, place, and time. He exhibits normal muscle tone. Coordination normal.  Skin: Skin is warm and dry. Rash noted. He is not diaphoretic. No erythema. No pallor.  Psychiatric: He has a normal mood and affect. His behavior is normal. Judgment and thought content normal.          Assessment & Plan:   Rash on legs: Differential includes allergic contact rash from plant material though it has persisted for > 2 months, indolent infection such as NOcardia, NTM and endemic mycoses such as blastomyces, histoplasma, sporotrix. Less likely would be cryptococcal infection, aspergillus infection given that he IS NOT on his suppressive drugs.  I've sent punch biopsy the pathology lab and sample to microbiology.  Send urine for histoplasma antigen Blastomyces antigen. I will send serum for cryptococcal antigen and fungal antibody panel I will check her compress metabolic panel CBC with differential a sedimentation rate C-reactive protein I will check his HIV status.  For now I would like him to continue to take some clindamycin while his punch biopsy sites heal up. Will apply topical antibiotics to these areas as well. I like him also to continue clotrimazole but without the corticosteroid component.  Will followup results of pathology and microbiology.  I spent greater than 60 minutes with the patient including greater than 50% of time in face to face counsel of the patient and in coordination of their care.  #2 Prostate cancer: sp xrt and  anti=androgen therapy by Dr. Isabel Caprice. ASk for notes from Dr. Ellin Goodie office as well

## 2013-01-24 NOTE — Patient Instructions (Addendum)
tAKE YOUR CLINDAMYCIN ANTIBIOTIC FOUR TIMES DAILY FOR 5 DAYS  STOP TAKING THE LOTRISONE CREAM THAT YOU HAVE  START TAKING OTC LOTRIMIN (CLOTRIMAZOLE 1%) BUT DO NOT PLACE IN WOUNDS  OK TO OVERBOOK

## 2013-01-24 NOTE — Addendum Note (Signed)
Addended by: Acey Lav N on: 01/24/2013 12:54 PM   Modules accepted: Level of Service

## 2013-01-25 ENCOUNTER — Other Ambulatory Visit: Payer: Medicare Other

## 2013-01-25 LAB — CRYPTOCOCCAL ANTIGEN: Crypto Ag: NEGATIVE

## 2013-01-28 LAB — FUNGAL ANTIBODIES PANEL, ID-BLOOD
Aspergillus fumigatus: NEGATIVE
Histoplasma Antibody, ID: NEGATIVE

## 2013-01-31 LAB — OTHER SOLSTAS TEST

## 2013-02-07 ENCOUNTER — Encounter: Payer: Self-pay | Admitting: Infectious Disease

## 2013-02-07 ENCOUNTER — Ambulatory Visit (INDEPENDENT_AMBULATORY_CARE_PROVIDER_SITE_OTHER): Payer: Medicare Other | Admitting: Infectious Disease

## 2013-02-07 VITALS — BP 145/82 | HR 89 | Temp 98.3°F | Wt 207.0 lb

## 2013-02-07 DIAGNOSIS — C61 Malignant neoplasm of prostate: Secondary | ICD-10-CM

## 2013-02-07 DIAGNOSIS — L0390 Cellulitis, unspecified: Secondary | ICD-10-CM

## 2013-02-07 DIAGNOSIS — R21 Rash and other nonspecific skin eruption: Secondary | ICD-10-CM

## 2013-02-07 DIAGNOSIS — L0291 Cutaneous abscess, unspecified: Secondary | ICD-10-CM

## 2013-02-07 MED ORDER — CEPHALEXIN 500 MG PO CAPS: 500.0000 mg | ORAL_CAPSULE | Freq: Four times a day (QID) | ORAL | Status: DC

## 2013-02-07 NOTE — Progress Notes (Signed)
Subjective:    Patient ID: Matthew Wilkerson, male    DOB: [birth year], 67 y.o.   MRN: [id number]  HPI   67 year old African American man who has been diagnosed with prostate cancer given injection of what I believe to be anti-androgen therapy by his urologist also receiving radiation therapy in last several months. He had an area on his ankle that apparently had been scratched by a piece of vegetation in his yard and became pruritic. He scratched this area and then afterwards developed a rash this persisted for last several months.  Was initially given a prescription for doxycycline which failed to improve any of his rash whatsoever. He is then given clindamycin she has had several courses of which seems to possibly have made a difference in terms of his rash possibly receding slightly. More recently he had a KOH prep done and was placed on combination corticosteroid and clotrimazole topical cream with resumption of his clindamycin. Again the area of the rash recessed slightly but has persisted as has his chronic edema.  We were  asked to see the patient to help assist in the workup and management of his rash in particular to make sure that the patient does not have a deeper infection with an indolent organism such as Nocardia, nontuberculous mycobacteria or endemic mycoses.  I performed two punch biopsies. ONe was sent for fungal stain and cx and one for AFB stain and culture and both stains and cultures are negative to date.   Pathology showed:  ANGIODERMATITIS, SEE COMMENT. Microscopic Comment Differential considerations are vascular stasis or previous trauma. The case was reviewed with Dr Pershing Proud, Dermatopathologist, who concurs  His serum crypto ag, aspergillus abs, cocci abs, histo abs negative and histo, blasto, cocci ags in urine are pending  I had asked him to use remaining clindamycin post biopsy and then to use anti-fungal ALONE without steroid. To my eye his erythema has not  improved appreciaby since then and he continues to have edema. He has absolutely NO fever, chills, nausea or malaise.   Review of Systems  Constitutional: Negative for fever, chills, diaphoresis, activity change, appetite change, fatigue and unexpected weight change.  HENT: Negative for congestion, rhinorrhea, sinus pressure, sneezing, sore throat and trouble swallowing.   Eyes: Negative for photophobia and visual disturbance.  Respiratory: Negative for cough, chest tightness, shortness of breath, wheezing and stridor.   Cardiovascular: Negative for chest pain, palpitations and leg swelling.  Gastrointestinal: Negative for nausea, vomiting, abdominal pain, diarrhea, constipation, blood in stool, abdominal distention and anal bleeding.  Genitourinary: Negative for dysuria, hematuria, flank pain and difficulty urinating.  Musculoskeletal: Negative for arthralgias, back pain, gait problem, joint swelling and myalgias.  Skin: Positive for rash. Negative for color change, pallor and wound.  Neurological: Negative for dizziness, tremors, weakness and light-headedness.  Hematological: Negative for adenopathy. Does not bruise/bleed easily.  Psychiatric/Behavioral: Negative for behavioral problems, confusion, sleep disturbance, dysphoric mood, decreased concentration and agitation.       Objective:   Physical Exam  Constitutional: He is oriented to person, place, and time. He appears well-developed and well-nourished. No distress.  HENT:  Head: Normocephalic and atraumatic.  Mouth/Throat: Oropharynx is clear and moist. No oropharyngeal exudate.  Eyes: Conjunctivae and EOM are normal. Pupils are equal, round, and reactive to light. No scleral icterus.  Neck: Normal range of motion. Neck supple. No JVD present.  Cardiovascular: Normal rate, regular rhythm and normal heart sounds.  Exam reveals no gallop and no friction rub.  No murmur heard. Pulmonary/Chest: Effort normal and breath sounds normal.  No respiratory distress. He has no wheezes. He has no rales. He exhibits no tenderness.  Abdominal: He exhibits no distension and no mass. There is no tenderness. There is no rebound and no guarding.  Musculoskeletal: He exhibits no edema and no tenderness.       Legs: Lymphadenopathy:    He has no cervical adenopathy.  Neurological: He is alert and oriented to person, place, and time. He exhibits normal muscle tone. Coordination normal.  Skin: Skin is warm and dry. Rash noted. He is not diaphoretic. No erythema. No pallor.  Psychiatric: He has a normal mood and affect. His behavior is normal. Judgment and thought content normal.          Assessment & Plan:   Rash on legs: Path seemed more c/w chronic venous stasis. He has NOT improved with ONLY topical antifungals and removal of steroids   I am going to go ahead and treat him aggressively as if this as a difficult to resolve bacterial cellulitis though I think what he has now is more likely chronic venous stasis.   I will give him 30 day course of high dose keflex 500mg  qid and also rx for compression stocking  #2 Prostate cancer: sp xrt and anti=androgen therapy by Dr. Isabel Caprice. ASk for notes from Dr. Ellin Goodie office as well

## 2013-02-12 ENCOUNTER — Other Ambulatory Visit: Payer: Self-pay | Admitting: Physician Assistant

## 2013-02-21 ENCOUNTER — Other Ambulatory Visit: Payer: Self-pay | Admitting: Physician Assistant

## 2013-02-21 LAB — FUNGUS CULTURE W SMEAR: Smear Result: NONE SEEN

## 2013-02-25 ENCOUNTER — Other Ambulatory Visit: Payer: Self-pay | Admitting: Internal Medicine

## 2013-03-08 LAB — AFB CULTURE WITH SMEAR (NOT AT ARMC): Acid Fast Smear: NONE SEEN

## 2013-03-11 ENCOUNTER — Other Ambulatory Visit: Payer: Self-pay | Admitting: Infectious Disease

## 2013-03-18 ENCOUNTER — Other Ambulatory Visit: Payer: Self-pay | Admitting: Infectious Disease

## 2013-03-19 ENCOUNTER — Ambulatory Visit (INDEPENDENT_AMBULATORY_CARE_PROVIDER_SITE_OTHER): Payer: Medicare Other | Admitting: Family Medicine

## 2013-03-19 VITALS — BP 118/72 | HR 82 | Temp 98.0°F | Resp 18 | Ht 67.0 in | Wt 204.0 lb

## 2013-03-19 DIAGNOSIS — R609 Edema, unspecified: Secondary | ICD-10-CM

## 2013-03-19 DIAGNOSIS — R21 Rash and other nonspecific skin eruption: Secondary | ICD-10-CM

## 2013-03-19 DIAGNOSIS — R600 Localized edema: Secondary | ICD-10-CM

## 2013-03-19 DIAGNOSIS — I10 Essential (primary) hypertension: Secondary | ICD-10-CM

## 2013-03-19 MED ORDER — CLOTRIMAZOLE-BETAMETHASONE 1-0.05 % EX CREA: 1.0000 "application " | TOPICAL_CREAM | Freq: Every day | CUTANEOUS | Status: DC

## 2013-03-19 MED ORDER — METOPROLOL SUCCINATE ER 100 MG PO TB24: 100.0000 mg | ORAL_TABLET | Freq: Every day | ORAL | Status: DC

## 2013-03-19 MED ORDER — FUROSEMIDE 20 MG PO TABS: 20.0000 mg | ORAL_TABLET | Freq: Every day | ORAL | Status: DC

## 2013-03-19 MED ORDER — AMLODIPINE BESYLATE 10 MG PO TABS: 10.0000 mg | ORAL_TABLET | Freq: Every day | ORAL | Status: DC

## 2013-03-19 NOTE — Progress Notes (Signed)
This chart was scribed for Elvina Sidle, MD by Matthew Wilkerson, ED Scribe. This patient was seen in room Room 3 and the patient's care was started 10:22 AM.   Patient Name: Matthew Wilkerson Date of Birth: 05-18-1945 Medical Record Number: [id number] Gender: male Date of Encounter: 03/19/2013  Chief Complaint: Follow-up   History of Present Illness:  Matthew Wilkerson is a 67 y.o. very pleasant male patient who presents with the following:  Pt presents with a gradual onset, unchanged erythematous swollen area to his lower right leg which first appeared 5 months ago. He states he has been seen by Dr. Cleta Alberts for the complaint, and a referral to Dr. Daiva Eves, infectious disease specialist. Pt feels his symptoms may be secondary to his radiation treatment. He says a biopsy was performed to the area a few weeks ago with no abnormal findings. He states the area appears the same with no improvement.  Pt requesting refills for all his medications.  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  Patient Active Problem List   Diagnosis Date Noted   Other and unspecified hyperlipidemia 08/17/2012   Headache(784.0) 08/17/2012   Prostate cancer 01/08/2012   HTN (hypertension) 08/01/2011   Left inguinal hernia 11/20/2010   Past Medical History  Diagnosis Date   Hyperlipidemia    Hypertension    Elevated PSA    Urinary obstruction    Hernia    Allergy    Prostate cancer dx 11/2010    prostate ca,PSA=7.2 T1c volume=30cc   Prostate ca 01/08/12    bx=Adenocarcinoma, gleason 6 (3+3), prostate volume 43 cc   Abnormal PSA 09/2011    7.63,    BPH (benign prostatic hyperplasia)    Inguinal hernia     left    S/P radiation therapy within four to twelve weeks 08/23/12-10/18/12    prostate,7800cGy/40 sessions   Past Surgical History  Procedure Laterality Date   Hernia repair      right inguinal done in early 70's   Prostate  biopsy  01/08/12    Adenocarcinoma,gleason=3+3=6,& 3+4=7,volume=43cc    History  Substance Use Topics   Smoking status: Never Smoker    Smokeless tobacco: Never Used   Alcohol Use: No   Family History  Problem Relation Age of Onset   Cancer Sister 2    pancreatic   Allergies  Allergen Reactions   Aspirin Nausea Only    High dose asp.... He tolerates 81 mg    Codeine Other (See Comments)    Derivatives, "causes my sinuses to hurt"    Medication list has been reviewed and updated.  Current Outpatient Prescriptions on File Prior to Visit  Medication Sig Dispense Refill   amLODipine (NORVASC) 10 MG tablet TAKE 1 TABLET (10 MG TOTAL) BY MOUTH DAILY.  30 tablet  0   aspirin 81 MG tablet Take 81 mg by mouth daily.         metoprolol succinate (TOPROL-XL) 100 MG 24 hr tablet Take 1 tablet (100 mg total) by mouth daily. Take with or immediately following a meal.  30 tablet  5   simvastatin (ZOCOR) 40 MG tablet Take 1 tablet (40 mg total) by mouth at bedtime.  30 tablet  5   calcium-vitamin D (OSCAL WITH D) 500-200 MG-UNIT per tablet Take 1 tablet by mouth 2 (two) times daily.       cephALEXin (KEFLEX) 500 MG capsule Take 1 capsule (500 mg  total) by mouth 4 (four) times daily.  120 capsule  0   clindamycin (CLEOCIN) 300 MG capsule Take 1 capsule (300 mg total) by mouth 4 (four) times daily.  20 capsule  0   clotrimazole (LOTRIMIN) 1 % cream Apply topically 2 (two) times daily.  30 g  0   simvastatin (ZOCOR) 40 MG tablet Take 1 tablet (40 mg total) by mouth daily. NEEDS OFFICE VISIT  90 tablet  0   traMADol (ULTRAM) 50 MG tablet Take 1 tablet (50 mg total) by mouth every 6 (six) hours as needed.  30 tablet  0   No current facility-administered medications on file prior to visit.    Review of Systems:    Physical Examination: Filed Vitals:   03/19/13 0927  BP: 118/72  Pulse: 82  Temp: 98 F (36.7 C)  Resp: 18   @vitals2 @ Body mass index is 31.94  kg/(m^2). Ideal Body Weight: @FLOWAMB ([id number])@  Left leg is swollen with irregular macular red rash measuring about 4 x 5 cm with posterior punch biopsy site which has healed. The area has underlying significant edema with shiny skin up to the midshin and extending down to the foot. The area is nontender  Past nose were reviewed which show a biopsy that was negative for infection of different kinds but positive for some angio-inflammation.  EKG / Labs / Xrays: None available at time of encounter  Assessment and Plan: This appears to be a fixed drug reaction. There is no sign of infection and no evidence that the antibiotics are doing much.  At this point it is reasonable to stop the simvastatin temporarily to see if the rash response. In addition we'll add Lotrisone cream and Lasix to reduce the swelling.  Hypertension - Plan: metoprolol succinate (TOPROL-XL) 100 MG 24 hr tablet, amLODipine (NORVASC) 10 MG tablet  Pedal edema - Plan: furosemide (LASIX) 20 MG tablet  Rash and nonspecific skin eruption  Signed, Elvina Sidle, MD    Elvina Sidle, MD

## 2013-03-19 NOTE — Patient Instructions (Signed)
I believe you have a fixed drug reaction, probably to the simvastatin.  So I want you to stop that medication. You should return in 2 weeks. Take the fluid pill called furosemide every morning Use the cream once daily.

## 2013-03-21 ENCOUNTER — Telehealth: Payer: Self-pay | Admitting: *Deleted

## 2013-03-21 NOTE — Telephone Encounter (Signed)
Pt cancelled his 12/3 appointment. Pt will be out of town for an unknown amount of time, will call to reschedule his appointment upon his return. Andree Coss, RN

## 2013-03-23 ENCOUNTER — Ambulatory Visit: Payer: Medicare Other | Admitting: Infectious Disease

## 2013-06-11 ENCOUNTER — Other Ambulatory Visit: Payer: Self-pay | Admitting: Physician Assistant

## 2013-10-01 ENCOUNTER — Ambulatory Visit (INDEPENDENT_AMBULATORY_CARE_PROVIDER_SITE_OTHER): Payer: Medicare Other | Admitting: Family Medicine

## 2013-10-01 VITALS — BP 130/84 | HR 73 | Temp 98.0°F | Resp 14 | Ht 66.75 in | Wt 198.4 lb

## 2013-10-01 DIAGNOSIS — I10 Essential (primary) hypertension: Secondary | ICD-10-CM

## 2013-10-01 MED ORDER — AMLODIPINE BESYLATE 10 MG PO TABS: 10.0000 mg | ORAL_TABLET | Freq: Every day | ORAL | Status: DC

## 2013-10-01 MED ORDER — METOPROLOL SUCCINATE ER 100 MG PO TB24: 100.0000 mg | ORAL_TABLET | Freq: Every day | ORAL | Status: DC

## 2013-10-01 NOTE — Patient Instructions (Signed)
Your BP looks fine today.  Keep up the good work with exercise.   Please send a copy of your labs to me when you can.  Take care!

## 2013-10-01 NOTE — Progress Notes (Signed)
[facility] [address], [place] [address] [id number]  Date:  10/01/2013   Name:  Matthew Wilkerson   DOB:  [birth year]   MRN:  [id number]  PCP:  Jenny Reichmann, MD    Chief Complaint: Hypertension   History of Present Illness:  Matthew Wilkerson is a 68 y.o. very pleasant male patient who presents with the following:  He is here today for a BP medication refill.  His BP has looked ok when he visits his urologist Dr. [name].  He has been treated for prostate cancer.    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  No CP with exertion or running.  However he has noted a little breast tenderness over the last month or so- will note sensitivity of the tissue when he dries the area with his towel after a shower.   Patient Active Problem List   Diagnosis Date Noted  . Other and unspecified hyperlipidemia 08/17/2012  . Headache(784.0) 08/17/2012  . Prostate cancer 01/08/2012  . HTN (hypertension) 08/01/2011  . Left inguinal hernia 11/20/2010    Past Medical History  Diagnosis Date  . Hyperlipidemia   . Hypertension   . Elevated PSA   . Urinary obstruction   . Hernia   . Allergy   . Prostate cancer dx 11/2010    prostate ca,PSA=7.2 T1c volume=30cc  . Prostate ca 01/08/12    bx=Adenocarcinoma, gleason 6 (3+3), prostate volume 43 cc  . Abnormal PSA 09/2011    7.63,   . BPH (benign prostatic hyperplasia)   . Inguinal hernia     left   . S/P radiation therapy within four to twelve weeks 08/23/12-10/18/12    prostate,7800cGy/40 sessions    Past Surgical History  Procedure Laterality Date  . Hernia repair      right inguinal done in early 70's  . Prostate biopsy  01/08/12    Adenocarcinoma,gleason=3+3=6,& 3+4=7,volume=43cc     History  Substance Use Topics  . Smoking status: Never Smoker   . Smokeless tobacco: Never Used  . Alcohol Use: No    Family History  Problem Relation Age of Onset  .  Cancer Sister 31    pancreatic    Allergies  Allergen Reactions  . Aspirin Nausea Only    High dose asp.... He tolerates 81 mg   . Codeine Other (See Comments)    Derivatives, "causes my sinuses to hurt"    Medication list has been reviewed and updated.  Current Outpatient Prescriptions on File Prior to Visit  Medication Sig Dispense Refill  . amLODipine (NORVASC) 10 MG tablet Take 1 tablet (10 mg total) by mouth daily.  30 tablet  5  . metoprolol succinate (TOPROL-XL) 100 MG 24 hr tablet Take 1 tablet (100 mg total) by mouth daily. Take with or immediately following a meal.  30 tablet  5  . aspirin 81 MG tablet Take 81 mg by mouth daily.        . calcium-vitamin D (OSCAL WITH D) 500-200 MG-UNIT per tablet Take 1 tablet by mouth 2 (two) times daily.      . clotrimazole-betamethasone (LOTRISONE) cream Apply 1 application topically daily.  30 g  0  . furosemide (LASIX) 20 MG tablet Take 1 tablet (20 mg total) by mouth daily.  30 tablet  3   No current facility-administered medications on file prior to visit.  Review of Systems:  As per HPI- otherwise negative.   Physical Examination: Filed Vitals:   10/01/13 0842  BP: 130/84  Pulse: 73  Temp: 98 F (36.7 C)  Resp: 14   Filed Vitals:   10/01/13 0842  Height: 5' 6.75" (1.695 m)  Weight: 198 lb 6.4 oz (89.994 kg)   Body mass index is 31.32 kg/(m^2). Ideal Body Weight: Weight in (lb) to have BMI = 25: 158.1  GEN: WDWN, NAD, Non-toxic, A & O x 3, mild overweight, looks well HEENT: Atraumatic, Normocephalic. Neck supple. No masses, No LAD. Ears and Nose: No external deformity. CV: RRR, No M/G/R. No JVD. No thrill. No extra heart sounds. PULM: CTA B, no wheezes, crackles, rhonchi. No retractions. No resp. distress. No accessory muscle use. No current breast tenderness or masses noted EXTR: No c/c/e NEURO Normal gait.  PSYCH: Normally interactive. Conversant. Not depressed or anxious appearing.  Calm demeanor.     Assessment and Plan: Hypertension - Plan: amLODipine (NORVASC) 10 MG tablet, metoprolol succinate (TOPROL-XL) 100 MG 24 hr tablet  BP looks fine, refilled medications as above. He plans to see Dr. [name] soon and would like to defer labs until then.  He will ask Dr. [name] about his breast tenderness- suspect this is related to his prostate cancer treatment.  Nicklos will let me know if he needs any more assistance in this regard.  Went over the difference between breast tenderness (tissues sore to the touch) and chest pain.  He will seek care if any chest pain develops.    Signed Lamar Blinks, MD

## 2013-10-04 ENCOUNTER — Ambulatory Visit: Payer: Medicare Other | Admitting: Emergency Medicine

## 2013-12-13 ENCOUNTER — Ambulatory Visit (INDEPENDENT_AMBULATORY_CARE_PROVIDER_SITE_OTHER): Payer: Medicare Other | Admitting: Emergency Medicine

## 2013-12-13 ENCOUNTER — Encounter: Payer: Self-pay | Admitting: Emergency Medicine

## 2013-12-13 VITALS — BP 134/77 | HR 67 | Temp 97.8°F | Resp 16 | Ht 67.5 in | Wt 194.0 lb

## 2013-12-13 DIAGNOSIS — R635 Abnormal weight gain: Secondary | ICD-10-CM

## 2013-12-13 DIAGNOSIS — D509 Iron deficiency anemia, unspecified: Secondary | ICD-10-CM

## 2013-12-13 DIAGNOSIS — C61 Malignant neoplasm of prostate: Secondary | ICD-10-CM

## 2013-12-13 LAB — CBC WITH DIFFERENTIAL/PLATELET
Basophils Absolute: 0 10*3/uL (ref 0.0–0.1)
Basophils Relative: 0 % (ref 0–1)
Eosinophils Absolute: 0.1 10*3/uL (ref 0.0–0.7)
Eosinophils Relative: 3 % (ref 0–5)
HCT: 40.4 % (ref 39.0–52.0)
Hemoglobin: 13.8 g/dL (ref 13.0–17.0)
Lymphocytes Relative: 25 % (ref 12–46)
Lymphs Abs: 1.2 10*3/uL (ref 0.7–4.0)
MCH: 31.1 pg (ref 26.0–34.0)
MCHC: 34.2 g/dL (ref 30.0–36.0)
MCV: 91 fL (ref 78.0–100.0)
Monocytes Absolute: 0.4 10*3/uL (ref 0.1–1.0)
Monocytes Relative: 8 % (ref 3–12)
Neutro Abs: 3 10*3/uL (ref 1.7–7.7)
Neutrophils Relative %: 64 % (ref 43–77)
Platelets: 169 10*3/uL (ref 150–400)
RBC: 4.44 MIL/uL (ref 4.22–5.81)
RDW: 14.9 % (ref 11.5–15.5)
WBC: 4.7 10*3/uL (ref 4.0–10.5)

## 2013-12-13 LAB — POCT GLYCOSYLATED HEMOGLOBIN (HGB A1C): Hemoglobin A1C: 5.8

## 2013-12-13 LAB — GLUCOSE, POCT (MANUAL RESULT ENTRY): POC GLUCOSE: 95 mg/dL (ref 70–99)

## 2013-12-13 NOTE — Progress Notes (Signed)
Subjective:  This chart was scribed for [name], MD by Donato Schultz, Medical Scribe. This patient was seen in Room 23 and the patient's care was started at 9:07 AM.   Patient ID: Matthew Wilkerson, male    DOB: 01-05-1946, 68 y.o.   MRN: [id number]  HPI HPI Comments: Matthew Wilkerson is a 68 y.o. male with a history of prostate cancer and hypertension who [facility] the [facility] complaining of weight gain from his waist up.  His previous weight was 202 but it has since come down to 194.  He has never weighed this much in his life.  He has cut back on soda and drank more water.  He exercises intermittently.  He has had his thyroid checked.  His last PSA was 0.68 and his next appointment is December 3.  He is treated with radiation therapy.  He takes an 81mg  aspirin tablet daily.     Past Medical History  Diagnosis Date  . Hyperlipidemia   . Hypertension   . Elevated PSA   . Urinary obstruction   . Hernia   . Allergy   . Prostate cancer dx 11/2010    prostate ca,PSA=7.2 T1c volume=30cc  . Prostate ca 01/08/12    bx=Adenocarcinoma, gleason 6 (3+3), prostate volume 43 cc  . Abnormal PSA 09/2011    7.63,   . BPH (benign prostatic hyperplasia)   . Inguinal hernia     left   . S/P radiation therapy within four to twelve weeks 08/23/12-10/18/12    prostate,7800cGy/40 sessions   Past Surgical History  Procedure Laterality Date  . Hernia repair      right inguinal done in early 70's  . Prostate biopsy  01/08/12    Adenocarcinoma,gleason=3+3=6,& 3+4=7,volume=43cc    Family History  Problem Relation Age of Onset  . Cancer Sister 34    pancreatic   History   Social History  . Marital Status: Divorced    Spouse Name: N/A    Number of Children: 2  . Years of Education: N/A   Occupational History  . 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 Smoking status: Never Smoker   .  Smokeless tobacco: Never Used  . Alcohol Use: No  . Drug Use: Yes    Special: MDMA (Ecstacy)  . Sexual Activity: Yes   Other Topics Concern  . Not on file   Social History Narrative  . No narrative on file   Allergies  Allergen Reactions  . Aspirin Nausea Only    High dose asp.... He tolerates 81 mg   . Codeine Other (See Comments)    Derivatives, "causes my sinuses to hurt"    Review of Systems   Objective:  Physical Exam  Nursing note and vitals reviewed. Constitutional: He is oriented to person, place, and time. He appears well-developed and well-nourished.  HENT:  Head: Normocephalic and atraumatic.  Right Ear: External ear normal.  Left Ear: External ear normal.  Nose: Nose normal.  Mouth/Throat: Oropharynx is clear and moist. No oropharyngeal exudate.  Eyes: EOM are normal.  Left subconjunctival hemorrhage.    Neck: Normal range of motion. Neck supple. No thyromegaly present.  Cardiovascular: Normal rate, regular rhythm and normal heart sounds.  Exam reveals no gallop and no friction rub.   No murmur heard. Pulmonary/Chest: Effort normal and breath sounds normal. No respiratory distress. He has no wheezes.  He has no rales.  Abdominal: Soft. Bowel sounds are normal. There is no tenderness.  Musculoskeletal: Normal range of motion.  Lymphadenopathy:    He has no cervical adenopathy.  Neurological: He is alert and oriented to person, place, and time.  Skin: Skin is warm and dry.  4x4in medial left lower leg change in pigmentation which is not tender to touch.  Psychiatric: He has a normal mood and affect. His behavior is normal.   Results for orders placed in visit on 12/13/13  GLUCOSE, POCT (MANUAL RESULT ENTRY)      Result Value Ref Range   POC Glucose 95  70 - 99 mg/dl  POCT GLYCOSYLATED HEMOGLOBIN (HGB A1C)      Result Value Ref Range   Hemoglobin A1C 5.8      BP 134/77  Pulse 67  Temp(Src) 97.8 F (36.6 C)  Resp 16  Ht 5' 7.5" (1.715 m)  Wt 194  lb (87.998 kg)  BMI 29.92 kg/m2  SpO2 95% Assessment & Plan:  No evidence of diabetes. I encouraged him to exercise to try and get his weight down. I did do his thyroid testing.  I personally performed the services described in this documentation, which was scribed in my presence. The recorded information has been reviewed and is accurate.

## 2013-12-14 ENCOUNTER — Telehealth: Payer: Self-pay | Admitting: Emergency Medicine

## 2013-12-14 LAB — T4, FREE: Free T4: 0.98 ng/dL (ref 0.80–1.80)

## 2013-12-14 LAB — TSH: TSH: 2.638 u[IU]/mL (ref 0.350–4.500)

## 2013-12-14 NOTE — Telephone Encounter (Signed)
Pt advised.

## 2013-12-14 NOTE — Telephone Encounter (Signed)
Call labs normal

## 2014-04-06 ENCOUNTER — Telehealth: Payer: Self-pay

## 2014-04-06 NOTE — Telephone Encounter (Signed)
Called patient to remind him to get his flu shot, however, there was no answer.

## 2014-07-28 ENCOUNTER — Ambulatory Visit: Payer: Self-pay | Admitting: Emergency Medicine

## 2014-08-15 ENCOUNTER — Ambulatory Visit (INDEPENDENT_AMBULATORY_CARE_PROVIDER_SITE_OTHER): Payer: Medicare Other | Admitting: Emergency Medicine

## 2014-08-15 ENCOUNTER — Encounter: Payer: Self-pay | Admitting: Emergency Medicine

## 2014-08-15 VITALS — BP 147/82 | HR 98 | Temp 97.9°F | Resp 16 | Ht 67.0 in | Wt 198.0 lb

## 2014-08-15 DIAGNOSIS — I10 Essential (primary) hypertension: Secondary | ICD-10-CM

## 2014-08-15 DIAGNOSIS — R51 Headache: Secondary | ICD-10-CM | POA: Diagnosis not present

## 2014-08-15 DIAGNOSIS — Z1211 Encounter for screening for malignant neoplasm of colon: Secondary | ICD-10-CM

## 2014-08-15 DIAGNOSIS — R519 Headache, unspecified: Secondary | ICD-10-CM

## 2014-08-15 DIAGNOSIS — N62 Hypertrophy of breast: Secondary | ICD-10-CM | POA: Diagnosis not present

## 2014-08-15 MED ORDER — AMLODIPINE BESYLATE 10 MG PO TABS: 10.0000 mg | ORAL_TABLET | Freq: Every day | ORAL | Status: DC

## 2014-08-15 MED ORDER — METOPROLOL SUCCINATE ER 100 MG PO TB24: 100.0000 mg | ORAL_TABLET | Freq: Every day | ORAL | Status: DC

## 2014-08-15 MED ORDER — TRAMADOL HCL 50 MG PO TABS: 50.0000 mg | ORAL_TABLET | Freq: Three times a day (TID) | ORAL | Status: DC | PRN

## 2014-08-15 NOTE — Patient Instructions (Signed)
Colorectal Cancer Screening  Colorectal cancer screening is done to detect early disease. Colorectal refers to the colon and rectum. The colon and rectum are located at the end of the large intestine (digestive system), and carry your bowel movements out of the body. Screening may be done even if you are not experiencing symptoms.   Colorectal cancer screening checks for:  · Polyps. These are small growths in the lining of the colon that can turn cancerous.  · Cancer that is already growing. Cancer is a cluster of abnormal cells that can cause problems in the body.  REASONS FOR COLORECTAL CANCER SCREENING  · It is common for polyps to form in the lining of the colon, especially in older people. These polyps can be cancerous or become cancerous.  · Caught early, colorectal cancer is treatable.  · Cancer can be life threatening. Detecting or preventing cancer early can save your life and allow you to enjoy life longer.  TYPES OF SCREENING  · Fecal occult blood testing. A stool sample is examined for blood in the laboratory.  · Sigmoidoscopy. A sigmoidoscope is used to examine the rectum and lower colon. A sigmoidoscope is a flexible tube with a camera that is inserted through your anus to examine your lower rectum.  · Colonoscopy. The longer colonoscope is used to examine the entire colon. A colonoscope is also a thin, flexible tube with a camera. This test examines the colon and rectum.  Other tests include:  · Digital rectal exam.  · Barium enema.  · Stool DNA test.  · Virtual colonoscopy is the use of computerized X-ray scan (computed tomography, CT) to take X-ray images of your colon.  WHO SHOULD HAVE COLORECTAL CANCER SCREENING?   Screening is recommended for all adults aged 71 to 75 years.   Screening is generally done every 5 to 10 years or more frequently if you have a family history or symptoms.   Screening is rarely recommended in adults aged 71 to 85 years. Screening is not recommended in adults aged 71  years and older.  Your caregiver may recommend screening at a younger age and more frequent screening if you have:  · A history of colorectal cancer or polyps.  · Family members with histories of colorectal cancer or polyps.  · Inflammatory bowel disease, such as ulcerative colitis or Crohn's disease.  · A type of hereditary colon cancer syndrome.  Talk with your caregiver about any symptoms, personal and family history.  SYMPTOMS OF COLORECTAL CANCER  It is important to discuss the following symptoms with your caregiver. These symptoms may be the result of other conditions and may be easily treated:  · Rectal bleeding.  · Blood in your stool.  · Changes in bowel movements (hard or loose stools). These changes may last several weeks.  · Abdominal cramping.  · Feeling the pressure to have a bowel movement when there is no bowel movement.  · Feeling tired or weak.  · Unexplained weight loss.  · Unexplained low red blood cell count. This may also be called iron deficiency anemia.  HOME CARE INSTRUCTIONS   · Follow up with your caregiver as directed.  · Follow all instructions for preparation before your test as well as after.  PREVENTION   Following healthy lifestyle habits each day can reduce your chance of getting colorectal cancer and many other types of cancer:  · Eat a healthy, well-balanced diet rich in fruits and vegetables and low in fats, sugars and cholesterol.  ·   Stay active. Try to exercise at least 4 to 6 times per week for 30 minutes.  · Maintain a healthy weight. Ask your caregiver what a healthy weight range is for you.  · Women should only drink 1 alcoholic drink per day. Men should only drink 2 alcoholic drinks per day.  · Quit smoking.  SEEK MEDICAL CARE IF:   · You experience abdominal or rectal symptoms (see Symptoms of Colorectal Cancer).  · Your gastrointestinal issues (constipation, diarrhea) do not go away as expected.  · You have questions or concerns.  FOR MORE INFORMATION  · American Academy  of Family Physicians www.familydoctor.org  · Centers for Disease Control and Prevention www.cdc.gov  · US Preventive Services Task Force www.uspreventiveservicestaskforce.org  · American Cancer Society www.cancer.org  MAKE SURE YOU:   · Understand these instructions.  · Will watch your condition.  · Will get help right away if you are not doing well or get worse.  Always follow up with your caregiver to find out the results of your tests. Not all test results may be available during your visit. If your test results are not back during the visit, make an appointment with your caregiver to find out the results. Do not assume everything is normal if you have not heard from your caregiver or the medical facility. It is important for you to follow up on all of your test results.   Document Released: 09/25/2009 Document Revised: 06/30/2011 Document Reviewed: 07/14/2013  ExitCare® Patient Information ©2015 [employer]. This information is not intended to replace advice given to you by your health care provider. Make sure you discuss any questions you have with your health care provider.

## 2014-08-15 NOTE — Progress Notes (Signed)
Subjective:    Patient ID: Matthew Wilkerson, male    DOB: Jul 22, 1945, 69 y.o.   MRN: [id number] This chart was scribed for Matthew Queen, MD by Zola Button, Medical Scribe. This patient was seen in room 22 and the patient's care was started at 11:23 AM.   HPI HPI Comments: Matthew Wilkerson is a 69 y.o. male with a hx of HTN and prostate cancer who presents [facility] the [facility] for a medication refill. Patient states he has been having intermittent headaches on certain days in the morning since allergy season has started. He believes his headaches area associated with seasonal allergies. He has not been taking any OTC allergy medications. He denies nose itchiness. Patient does not remember when his last colonoscopy was. He is hesitant to have a colonoscopy. No FMHx of colon cancer per patient. He does not see his eye doctor yearly. He has been having to use reading glasses sometimes when reading. Patient was diagnosed with prostate cancer in 2014.  He states he has lost 5-7 points since his last visit, but has noticed more fat in his torso. He denies any lumps in his breast area.   Patient has some reunions coming up this summer. He has a daughter in [place] for graduate school, studying IT, business law, and business administration. He has sisters, a brother and nieces who live in the [place].  Review of Systems  Allergic/Immunologic: Positive for environmental allergies.  Neurological: Positive for headaches.       Objective:   Physical Exam CONSTITUTIONAL: Well developed/well nourished HEAD: Normocephalic/atraumatic EYES: EOM/PERRL ENMT: Mucous membranes moist NECK: supple no meningeal signs SPINE: entire spine nontender CV: S1/S2 noted, no murmurs/rubs/gallops noted LUNGS/CHEST: Lungs are clear to auscultation bilaterally, no apparent distress. Mild gynecomastia on exam. ABDOMEN: soft, nontender, no rebound or guarding GU: no cva tenderness NEURO: Pt is awake/alert,  moves all extremitiesx4 EXTREMITIES: pulses normal, full ROM SKIN: warm, color normal PSYCH: no abnormalities of mood noted Results for orders placed or performed in visit on 12/13/13  T4, free  Result Value Ref Range   Free T4 0.98 0.80 - 1.80 ng/dL  TSH  Result Value Ref Range   TSH 2.638 0.350 - 4.500 uIU/mL  CBC with Differential  Result Value Ref Range   WBC 4.7 4.0 - 10.5 K/uL   RBC 4.44 4.22 - 5.81 MIL/uL   Hemoglobin 13.8 13.0 - 17.0 g/dL   HCT 40.4 39.0 - 52.0 %   MCV 91.0 78.0 - 100.0 fL   MCH 31.1 26.0 - 34.0 pg   MCHC 34.2 30.0 - 36.0 g/dL   RDW 14.9 11.5 - 15.5 %   Platelets 169 150 - 400 K/uL   Neutrophils Relative % 64 43 - 77 %   Neutro Abs 3.0 1.7 - 7.7 K/uL   Lymphocytes Relative 25 12 - 46 %   Lymphs Abs 1.2 0.7 - 4.0 K/uL   Monocytes Relative 8 3 - 12 %   Monocytes Absolute 0.4 0.1 - 1.0 K/uL   Eosinophils Relative 3 0 - 5 %   Eosinophils Absolute 0.1 0.0 - 0.7 K/uL   Basophils Relative 0 0 - 1 %   Basophils Absolute 0.0 0.0 - 0.1 K/uL   Smear Review Criteria for review not met   POCT glucose (manual entry)  Result Value Ref Range   POC Glucose 95 70 - 99 mg/dl  POCT glycosylated hemoglobin (Hb A1C)  Result Value Ref Range   Hemoglobin  A1C 5.8          Assessment & Plan:  1. Essential hypertension  - metoprolol succinate (TOPROL-XL) 100 MG 24 hr tablet; Take 1 tablet (100 mg total) by mouth daily. Take with or immediately following a meal.  Dispense: 90 tablet; Refill: 3 - amLODipine (NORVASC) 10 MG tablet; Take 1 tablet (10 mg total) by mouth daily.  Dispense: 90 tablet; Refill: 3  2. Frequent headaches  - traMADol (ULTRAM) 50 MG tablet; Take 1 tablet (50 mg total) by mouth every 8 (eight) hours as needed.  Dispense: 30 tablet; Refill: 0  3. Gynecomastia  - Prolactin. I suspect this is medicine related  4. Encounter for screening fecal occult blood testing  - POC Hemoccult Bld/Stl (3-Cd Home Screen); Future. The patient was adamant he  does not want to undergo colonoscopy. He understands this will miss early cancers or polyps. I could not convince him to have this done.   Matthew Queen, MD  [facility]  08/15/2014 12:24 PM

## 2014-08-16 LAB — PROLACTIN: PROLACTIN: 8.8 ng/mL (ref 2.1–17.1)

## 2014-08-23 LAB — POC HEMOCCULT BLD/STL (HOME/3-CARD/SCREEN)
FECAL OCCULT BLD: NEGATIVE
FECAL OCCULT BLD: NEGATIVE
FECAL OCCULT BLD: NEGATIVE

## 2014-08-23 NOTE — Addendum Note (Signed)
Addended by: Constance Goltz on: 08/23/2014 09:54 AM   Modules accepted: Orders

## 2014-09-15 ENCOUNTER — Other Ambulatory Visit: Payer: Self-pay | Admitting: Family Medicine

## 2014-10-10 DIAGNOSIS — C61 Malignant neoplasm of prostate: Secondary | ICD-10-CM | POA: Diagnosis not present

## 2014-10-10 DIAGNOSIS — N401 Enlarged prostate with lower urinary tract symptoms: Secondary | ICD-10-CM | POA: Diagnosis not present

## 2014-10-10 DIAGNOSIS — N138 Other obstructive and reflux uropathy: Secondary | ICD-10-CM | POA: Diagnosis not present

## 2014-10-10 DIAGNOSIS — N529 Male erectile dysfunction, unspecified: Secondary | ICD-10-CM | POA: Diagnosis not present

## 2014-12-19 ENCOUNTER — Ambulatory Visit: Payer: Medicare Other | Admitting: Emergency Medicine

## 2015-01-18 ENCOUNTER — Ambulatory Visit (INDEPENDENT_AMBULATORY_CARE_PROVIDER_SITE_OTHER): Payer: Medicare Other | Admitting: Emergency Medicine

## 2015-01-18 VITALS — BP 145/76 | HR 76 | Temp 98.6°F | Resp 16 | Wt 205.0 lb

## 2015-01-18 DIAGNOSIS — I10 Essential (primary) hypertension: Secondary | ICD-10-CM

## 2015-01-18 DIAGNOSIS — D509 Iron deficiency anemia, unspecified: Secondary | ICD-10-CM

## 2015-01-18 LAB — CBC WITH DIFFERENTIAL/PLATELET
Basophils Absolute: 0 10*3/uL (ref 0.0–0.1)
Basophils Relative: 0 % (ref 0–1)
EOS PCT: 3 % (ref 0–5)
Eosinophils Absolute: 0.2 10*3/uL (ref 0.0–0.7)
HEMATOCRIT: 40.7 % (ref 39.0–52.0)
Hemoglobin: 14 g/dL (ref 13.0–17.0)
LYMPHS ABS: 1.7 10*3/uL (ref 0.7–4.0)
LYMPHS PCT: 31 % (ref 12–46)
MCH: 31.6 pg (ref 26.0–34.0)
MCHC: 34.4 g/dL (ref 30.0–36.0)
MCV: 91.9 fL (ref 78.0–100.0)
MONO ABS: 0.3 10*3/uL (ref 0.1–1.0)
MONOS PCT: 6 % (ref 3–12)
MPV: 10.8 fL (ref 8.6–12.4)
Neutro Abs: 3.4 10*3/uL (ref 1.7–7.7)
Neutrophils Relative %: 60 % (ref 43–77)
Platelets: 170 10*3/uL (ref 150–400)
RBC: 4.43 MIL/uL (ref 4.22–5.81)
RDW: 14.9 % (ref 11.5–15.5)
WBC: 5.6 10*3/uL (ref 4.0–10.5)

## 2015-01-18 LAB — LIPID PANEL
Cholesterol: 202 mg/dL — ABNORMAL HIGH (ref 125–200)
HDL: 38 mg/dL — ABNORMAL LOW (ref >=40)
LDL CALC: 135 mg/dL — AB (ref <130)
Total CHOL/HDL Ratio: 5.3 Ratio — ABNORMAL HIGH (ref <=5.0)
Triglycerides: 143 mg/dL (ref <150)
VLDL: 29 mg/dL (ref <30)

## 2015-01-18 LAB — BASIC METABOLIC PANEL WITH GFR
BUN: 20 mg/dL (ref 7–25)
CALCIUM: 9.3 mg/dL (ref 8.6–10.3)
CO2: 25 mmol/L (ref 20–31)
Chloride: 106 mmol/L (ref 98–110)
Creat: 1.29 mg/dL — ABNORMAL HIGH (ref 0.70–1.18)
GFR, EST AFRICAN AMERICAN: 64 mL/min (ref >=60)
GFR, Est Non African American: 55 mL/min — ABNORMAL LOW (ref >=60)
Glucose, Bld: 82 mg/dL (ref 65–99)
Potassium: 4 mmol/L (ref 3.5–5.3)
Sodium: 140 mmol/L (ref 135–146)

## 2015-01-18 NOTE — Progress Notes (Addendum)
This chart was scribed for [name], MD by Moises Blood, Medical Scribe. This patient was seen in Room 22 and the patient's care was started 9:59 AM.  Chief Complaint:  Chief Complaint  Patient presents with  . Follow-up  . Hypertension    HPI: Matthew Wilkerson is a 69 y.o. male who has h/o prostate cancer reports to [facility] today for follow up on HTN.  He takes amlodipine and toprol in the morning, and an aspirin as well.  He refused having colonoscopy done and was informed about colon guard.  He sees his prostate cancer doctor a couple times a year.   He runs in the winter and not in the summer, and this might be the cause of his increased weight.   He refused all vaccines today.   His daughter lives in [place].   Past Medical History  Diagnosis Date  . Hyperlipidemia   . Hypertension   . Elevated PSA   . Urinary obstruction   . Hernia   . Allergy   . Prostate cancer dx 11/2010    prostate ca,PSA=7.2 T1c volume=30cc  . Prostate ca 01/08/12    bx=Adenocarcinoma, gleason 6 (3+3), prostate volume 43 cc  . Abnormal PSA 09/2011    7.63,   . BPH (benign prostatic hyperplasia)   . Inguinal hernia     left   . S/P radiation therapy within four to twelve weeks 08/23/12-10/18/12    prostate,7800cGy/40 sessions   Past Surgical History  Procedure Laterality Date  . Hernia repair      right inguinal done in early 70's  . Prostate biopsy  01/08/12    Adenocarcinoma,gleason=3+3=6,& 3+4=7,volume=43cc    Social History   Social History  . Marital Status: Divorced    Spouse Name: N/A  . Number of Children: 2  . Years of Education: N/A   Occupational History  . 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 Smoking status: Never Smoker   . Smokeless tobacco: Never Used  . Alcohol Use: No  . Drug Use: Yes    Special: MDMA (Ecstacy)  . Sexual Activity: Yes   Other Topics Concern  . Not on file    Social History Narrative   Family History  Problem Relation Age of Onset  . Cancer Sister 64    pancreatic   Allergies  Allergen Reactions  . Aspirin Nausea Only    High dose asp.... He tolerates 81 mg   . Codeine Other (See Comments)    Derivatives, "causes my sinuses to hurt"   Prior to Admission medications   Medication Sig Start Date End Date Taking? Authorizing Provider  amLODipine (NORVASC) 10 MG tablet Take 1 tablet (10 mg total) by mouth daily. 08/15/14  Yes [name], MD  aspirin 81 MG tablet Take 81 mg by mouth daily.     Yes Historical Provider, MD  metoprolol succinate (TOPROL-XL) 100 MG 24 hr tablet Take 1 tablet (100 mg total) by mouth daily. Take with or immediately following a meal. 08/15/14  Yes [name], MD     ROS:  Constitutional: negative for chills, fever, night sweats, weight changes, or fatigue  HEENT: negative for vision changes, hearing loss, congestion, rhinorrhea, ST, epistaxis, or sinus pressure Cardiovascular: negative for chest pain or palpitations Respiratory: negative for hemoptysis, wheezing, shortness of breath, or cough Abdominal: negative for abdominal pain, nausea, vomiting, diarrhea, or constipation  Dermatological: negative for rash Neurologic: negative for headache, dizziness, or syncope All other systems reviewed and are otherwise negative with the exception to those above and in the HPI.  PHYSICAL EXAM: Filed Vitals:   01/18/15 0945  BP: 145/76  Pulse: 76  Temp: 98.6 F (37 C)  Resp: 16   Body mass index is 32.1 kg/(m^2).   General: Alert, no acute distress HEENT:  Normocephalic, atraumatic, oropharynx patent. Eye: [name] [facility] Cardiovascular:  Regular rate and rhythm, no rubs murmurs or gallops.  No Carotid bruits, radial pulse intact. No pedal edema; No carotid bruit Respiratory: Clear to auscultation bilaterally.  No wheezes, rales, or rhonchi.  No cyanosis, no use of accessory musculature Abdominal: No  organomegaly, abdomen is soft and non-tender, positive bowel sounds. No masses. Musculoskeletal: Gait intact. No edema, tenderness Skin: No rashes. Neurologic: Facial musculature symmetric. Psychiatric: Patient acts appropriately throughout our interaction.  Lymphatic: No cervical or submandibular lymphadenopathy Genitourinary/Anorectal: No acute findings Rechecked BP in room (sitting) : 140/80   LABS: Results for orders placed or performed in visit on 08/15/14  Prolactin  Result Value Ref Range   Prolactin 8.8 2.1 - 17.1 ng/mL  POC Hemoccult Bld/Stl (3-Cd Home Screen)  Result Value Ref Range   Card #1 Date     Fecal Occult Blood, POC Negative    Card #2 Date     Card #2 Fecal Occult Blod, POC Negative    Card #3 Date     Card #3 Fecal Occult Blood, POC Negative      EKG/XRAY:   Primary read interpreted by Dr. Everlene Farrier at [facility].   ASSESSMENT/PLAN: Patient has regular follow-up with his prostate cancer. His blood pressure medications were refilled. He declines any vaccinations.  By signing my name below, I, Moises Blood, attest that this documentation has been prepared under the direction and in the presence of [name], MD. Electronically Signed: Moises Blood, [place]. 01/18/2015 , 9:59 AM . I personally performed the services described in this documentation, which was scribed in my presence. The recorded information has been reviewed and is accurate.   Gross sideeffects, risk and benefits, and alternatives of medications d/w patient. Patient is aware that all medications have potential sideeffects and we are unable to predict every sideeffect or drug-drug interaction that may occur.  [name] MD 01/18/2015 9:59 AM

## 2015-01-19 ENCOUNTER — Other Ambulatory Visit: Payer: Self-pay

## 2015-01-19 DIAGNOSIS — E7800 Pure hypercholesterolemia, unspecified: Secondary | ICD-10-CM

## 2015-01-19 MED ORDER — ATORVASTATIN CALCIUM 20 MG PO TABS: 20.0000 mg | ORAL_TABLET | Freq: Every day | ORAL | Status: DC

## 2015-01-23 ENCOUNTER — Telehealth: Payer: Self-pay

## 2015-01-23 ENCOUNTER — Encounter: Payer: Self-pay | Admitting: Emergency Medicine

## 2015-01-23 NOTE — Telephone Encounter (Signed)
Dr. Daub any suggestions? 

## 2015-01-23 NOTE — Telephone Encounter (Signed)
If the Lipitor is causing headaches we can change him to Crestor 5 mg 1 a day and see if he tolerates this better. He can have #30 tablets with 5 refills. He can go ahead and stop the Lipitor.

## 2015-01-23 NOTE — Telephone Encounter (Signed)
Patient has been taking Atorvastatin 20 mg on 01/19/15 and has been having a lot headaches. Patient wants to know if he should continue the medication or if it should be changed? Patient uses [facility] on [address] and his call back number is [phone]

## 2015-01-24 MED ORDER — ROSUVASTATIN CALCIUM 5 MG PO TABS: 5.0000 mg | ORAL_TABLET | Freq: Every day | ORAL | Status: DC

## 2015-01-24 NOTE — Telephone Encounter (Signed)
Sent in new Rx and notified pt. Advised him to come in fasting for next appt in March so that his lipids can be rechecked. Pt agreed and will call me back if he has any adverse SEs from crestor.

## 2015-02-08 ENCOUNTER — Ambulatory Visit (INDEPENDENT_AMBULATORY_CARE_PROVIDER_SITE_OTHER): Payer: Medicare Other | Admitting: Emergency Medicine

## 2015-02-08 ENCOUNTER — Encounter: Payer: Self-pay | Admitting: Emergency Medicine

## 2015-02-08 VITALS — BP 124/77 | HR 80 | Temp 98.0°F | Resp 16 | Ht 67.0 in | Wt 203.0 lb

## 2015-02-08 DIAGNOSIS — B353 Tinea pedis: Secondary | ICD-10-CM | POA: Diagnosis not present

## 2015-02-08 NOTE — Progress Notes (Addendum)
This chart was scribed for [name], MD by Moises Blood, Medical Scribe. This patient was seen in Room 22 and the patient's care was started 11:32 AM.  Chief Complaint:  Chief Complaint  Patient presents with  . Nail Problem    has discoloration in between toes on left foot    HPI: Matthew Wilkerson is a 69 y.o. male who reports to [facility] today complaining of discoloration between his toes on left foot.  He's been using athlete's foots spray in the area. He's also been keeping it dry.   Past Medical History  Diagnosis Date  . Hyperlipidemia   . Hypertension   . Elevated PSA   . Urinary obstruction   . Hernia   . Allergy   . Prostate cancer [facility]) dx 11/2010    prostate ca,PSA=7.2 T1c volume=30cc  . Prostate CA ([place]) 01/08/12    bx=Adenocarcinoma, gleason 6 (3+3), prostate volume 43 cc  . Abnormal PSA 09/2011    7.63,   . BPH (benign prostatic hyperplasia)   . Inguinal hernia     left   . S/P radiation therapy within four to twelve weeks 08/23/12-10/18/12    prostate,7800cGy/40 sessions   Past Surgical History  Procedure Laterality Date  . Hernia repair      right inguinal done in early 70's  . Prostate biopsy  01/08/12    Adenocarcinoma,gleason=3+3=6,& 3+4=7,volume=43cc    Social History   Social History  . Marital Status: Divorced    Spouse Name: N/A  . Number of Children: 2  . Years of Education: N/A   Occupational History  . 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 Smoking status: Never Smoker   . Smokeless tobacco: Never Used  . Alcohol Use: No  . Drug Use: Yes    Special: MDMA (Ecstacy)  . Sexual Activity: Yes   Other Topics Concern  . None   Social History Narrative   Family History  Problem Relation Age of Onset  . Cancer Sister 80    pancreatic   Allergies  Allergen Reactions  . Aspirin Nausea Only    High dose asp.... He tolerates 81 mg   . Codeine Other (See  Comments)    Derivatives, "causes my sinuses to hurt"   Prior to Admission medications   Medication Sig Start Date End Date Taking? Authorizing Provider  amLODipine (NORVASC) 10 MG tablet Take 1 tablet (10 mg total) by mouth daily. 08/15/14   [name], MD  aspirin 81 MG tablet Take 81 mg by mouth daily.      Historical Provider, MD  atorvastatin (LIPITOR) 20 MG tablet Take 1 tablet (20 mg total) by mouth at bedtime. 01/19/15   [name], MD  metoprolol succinate (TOPROL-XL) 100 MG 24 hr tablet Take 1 tablet (100 mg total) by mouth daily. Take with or immediately following a meal. 08/15/14   [name], MD  rosuvastatin (CRESTOR) 5 MG tablet Take 1 tablet (5 mg total) by mouth daily. 01/24/15   [name], MD     ROS:  Constitutional: negative for chills, fever, night sweats, weight changes, or fatigue  HEENT: negative for vision changes, hearing loss, congestion, rhinorrhea, ST, epistaxis, or sinus pressure Cardiovascular: negative for chest pain or palpitations Respiratory: negative for hemoptysis, wheezing, shortness of breath, or cough Abdominal: negative for abdominal pain, nausea, vomiting, diarrhea, or constipation Dermatological: positive for fungus (  between left toes)  Neurologic: negative for headache, dizziness, or syncope All other systems reviewed and are otherwise negative with the exception to those above and in the HPI.  PHYSICAL EXAM: Filed Vitals:   02/08/15 1127  BP: 124/77  Pulse: 80  Temp: 98 F (36.7 C)  Resp: 16   Body mass index is 31.79 kg/(m^2).   General: Alert, no acute distress HEENT:  Normocephalic, atraumatic, oropharynx patent. Eye: [name] [facility] Cardiovascular:  Regular rate and rhythm, no rubs murmurs or gallops.  No Carotid bruits, radial pulse intact. No pedal edema.  Respiratory: Clear to auscultation bilaterally.  No wheezes, rales, or rhonchi.  No cyanosis, no use of accessory musculature Abdominal: No organomegaly, abdomen is  soft and non-tender, positive bowel sounds. No masses. Musculoskeletal: Gait intact. No edema, tenderness Skin: tinea pedis between 3rd and 4th toes in left foot Neurologic: Facial musculature symmetric. Psychiatric: Patient acts appropriately throughout our interaction.  Lymphatic: No cervical or submandibular lymphadenopathy Genitourinary/Anorectal: No acute findings   LABS:    EKG/XRAY:   Primary read interpreted by Dr. Everlene Farrier at [facility].   ASSESSMENT/PLAN: Patient has whitish skin between the third and both toes consistent with tinea pedis. He will treat this with Lotrimin spray. he will keep it  very dry.I personally performed the services described in this documentation, which was scribed in my presence. The recorded information has been reviewed and is accurate.  By signing my name below, I, Moises Blood, attest that this documentation has been prepared under the direction and in the presence of [name], MD. Electronically Signed: Moises Blood, Joanna. 02/08/2015 , 11:48 AM .    Johney [place] sideeffects, risk and benefits, and alternatives of medications d/w patient. Patient is aware that all medications have potential sideeffects and we are unable to predict every sideeffect or drug-drug interaction that may occur.  [name] MD 02/08/2015 11:48 AM

## 2015-03-10 ENCOUNTER — Ambulatory Visit (INDEPENDENT_AMBULATORY_CARE_PROVIDER_SITE_OTHER): Payer: Medicare Other | Admitting: Family Medicine

## 2015-03-10 VITALS — BP 128/78 | HR 88 | Temp 98.5°F | Resp 16 | Ht 68.0 in | Wt 203.0 lb

## 2015-03-10 DIAGNOSIS — H1131 Conjunctival hemorrhage, right eye: Secondary | ICD-10-CM | POA: Diagnosis not present

## 2015-03-10 NOTE — Patient Instructions (Signed)
Subconjunctival Hemorrhage °Subconjunctival hemorrhage is bleeding that happens between the white part of your eye (sclera) and the clear membrane that covers the outside of your eye (conjunctiva). There are many tiny blood vessels near the surface of your eye. A subconjunctival hemorrhage happens when one or more of these vessels breaks and bleeds, causing a red patch to appear on your eye. This is similar to a bruise. °Depending on the amount of bleeding, the red patch may only cover a small area of your eye or it may cover the entire visible part of the sclera. If a lot of blood collects under the conjunctiva, there may also be swelling. Subconjunctival hemorrhages do not affect your vision or cause pain, but your eye may feel irritated if there is swelling. Subconjunctival hemorrhages usually do not require treatment, and they disappear on their own within two weeks. °CAUSES °This condition may be caused by: °· Mild trauma, such as rubbing your eye too hard. °· Severe trauma or blunt injuries. °· Coughing, sneezing, or vomiting. °· Straining, such as when lifting a heavy object. °· High blood pressure. °· Recent eye surgery. °· A history of diabetes. °· Certain medicines, especially blood thinners (anticoagulants). °· Other conditions, such as eye tumors, bleeding disorders, or blood vessel abnormalities. °Subconjunctival hemorrhages can happen without an obvious cause.  °SYMPTOMS  °Symptoms of this condition include: °· A bright red or dark red patch on the white part of the eye. °¨ The red area may spread out to cover a larger area of the eye before it goes away. °¨ The red area may turn brownish-yellow before it goes away. °· Swelling. °· Mild eye irritation. °DIAGNOSIS °This condition is diagnosed with a physical exam. If your subconjunctival hemorrhage was caused by trauma, your health care provider may refer you to an eye specialist (ophthalmologist) or another specialist to check for other injuries. You  may have other tests, including: °· An eye exam. °· A blood pressure check. °· Blood tests to check for bleeding disorders. °If your subconjunctival hemorrhage was caused by trauma, X-rays or a CT scan may be done to check for other injuries. °TREATMENT °Usually, no treatment is needed. Your health care provider may recommend eye drops or cold compresses to help with discomfort. °HOME CARE INSTRUCTIONS °· Take over-the-counter and prescription medicines only as directed by your health care provider. °· Use eye drops or cold compresses to help with discomfort as directed by your health care provider. °· Avoid activities, things, and environments that may irritate or injure your eye. °· Keep all follow-up visits as told by your health care provider. This is important. °SEEK MEDICAL CARE IF: °· You have pain in your eye. °· The bleeding does not go away within 3 weeks. °· You keep getting new subconjunctival hemorrhages. °SEEK IMMEDIATE MEDICAL CARE IF: °· Your vision changes or you have difficulty seeing. °· You suddenly develop severe sensitivity to light. °· You develop a severe headache, persistent vomiting, confusion, or abnormal tiredness (lethargy). °· Your eye seems to bulge or protrude from your eye socket. °· You develop unexplained bruises on your body. °· You have unexplained bleeding in another area of your body. °  °This information is not intended to replace advice given to you by your health care provider. Make sure you discuss any questions you have with your health care provider. °  °Document Released: 04/07/2005 Document Revised: 12/27/2014 Document Reviewed: 06/14/2014 °Elsevier Interactive Patient Education ©2016 Elsevier Inc. ° °

## 2015-03-10 NOTE — Progress Notes (Signed)
Subjective:    Patient ID: Matthew Wilkerson, male    DOB: 11-Sep-1945, 69 y.o.   MRN: [id number]  03/10/2015  Eye Problem   HPI This 69 y.o. male presents for evaluation of R eye irritation.  Was running four days ago and had FB sensation; started rubbing eyes and developed hemorrhage.  No pain.  +FB sensation persists.  No photophobia.  No pain with blinking.  FB sensation on medial aspect of eye.  No tearing.  Intermittent drainage white.  No blurred vision; no diplopia.  Wears reading glasses; non contacts.  ASA 81mg  daily.    Review of Systems  Constitutional: Negative for fever, chills, diaphoresis and fatigue.  Eyes: Positive for discharge and redness. Negative for photophobia, pain, itching and visual disturbance.    Past Medical History  Diagnosis Date  . Hyperlipidemia   . Hypertension   . Elevated PSA   . Urinary obstruction   . Hernia   . Allergy   . Prostate cancer [facility]) dx 11/2010    prostate ca,PSA=7.2 T1c volume=30cc  . Prostate CA ([place]) 01/08/12    bx=Adenocarcinoma, gleason 6 (3+3), prostate volume 43 cc  . Abnormal PSA 09/2011    7.63,   . BPH (benign prostatic hyperplasia)   . Inguinal hernia     left   . S/P radiation therapy within four to twelve weeks 08/23/12-10/18/12    prostate,7800cGy/40 sessions   Past Surgical History  Procedure Laterality Date  . Hernia repair      right inguinal done in early 70's  . Prostate biopsy  01/08/12    Adenocarcinoma,gleason=3+3=6,& 3+4=7,volume=43cc    Allergies  Allergen Reactions  . Aspirin Nausea Only    High dose asp.... He tolerates 81 mg   . Codeine Other (See Comments)    Derivatives, "causes my sinuses to hurt"   Current Outpatient Prescriptions  Medication Sig Dispense Refill  . amLODipine (NORVASC) 10 MG tablet Take 1 tablet (10 mg total) by mouth daily. 90 tablet 3  . aspirin 81 MG tablet Take 81 mg by mouth daily.      . metoprolol succinate (TOPROL-XL) 100 MG 24 hr tablet Take 1 tablet (100 mg  total) by mouth daily. Take with or immediately following a meal. 90 tablet 3  . rosuvastatin (CRESTOR) 5 MG tablet Take 1 tablet (5 mg total) by mouth daily. 30 tablet 5   No current facility-administered medications for this visit.   Social History   Social History  . Marital Status: Divorced    Spouse Name: N/A  . Number of Children: 2  . Years of Education: N/A   Occupational History  . 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 Smoking status: Never Smoker   . Smokeless tobacco: Never Used  . Alcohol Use: No  . Drug Use: Yes    Special: MDMA (Ecstacy)  . Sexual Activity: Yes   Other Topics Concern  . Not on file   Social History Narrative   Family History  Problem Relation Age of Onset  . Cancer Sister 93    pancreatic       Objective:    BP 128/78 mmHg  Pulse 88  Temp(Src) 98.5 F (36.9 C) (Oral)  Resp 16  Ht 5\' 8"  (1.727 m)  Wt 203 lb (92.08 kg)  BMI 30.87 kg/m2  SpO2 98% Physical Exam  Constitutional: He appears well-developed and well-nourished. No distress.  HENT:  Head: Normocephalic and atraumatic.  Eyes: EOM are normal. Pupils are equal, round, and reactive to light. Right eye exhibits no discharge, no exudate and no hordeolum. No foreign body present in the right eye. Left eye exhibits no discharge. Right conjunctiva is not injected. Right conjunctiva has a hemorrhage. Left conjunctiva is not injected. Left conjunctiva has no hemorrhage.  Slit lamp exam:      The right eye shows no corneal abrasion, no corneal ulcer, no foreign body, no hyphema, no fluorescein uptake and no anterior chamber bulge.    Skin: He is not diaphoretic.        Assessment & Plan:   1. Subconjunctival hemorrhage of right eye    -New. -Reassurance provided. -Recommend applying artificial tears or ointment for comfort. -RTC for vision changes, pain, photophobia, or excessive tearing or  drainage.   No orders of the defined types were placed in this encounter.   No orders of the defined types were placed in this encounter.    No Follow-up on file.    Yuridia Couts Elayne Guerin, M.D. [facility] [address] [place], [place]  [address] [phone] phone [phone] fax

## 2015-04-06 ENCOUNTER — Ambulatory Visit (INDEPENDENT_AMBULATORY_CARE_PROVIDER_SITE_OTHER): Payer: Medicare Other | Admitting: Emergency Medicine

## 2015-04-06 VITALS — BP 118/68 | HR 76 | Temp 98.2°F | Resp 17 | Ht 68.0 in | Wt 202.0 lb

## 2015-04-06 DIAGNOSIS — H9202 Otalgia, left ear: Secondary | ICD-10-CM | POA: Diagnosis not present

## 2015-04-06 DIAGNOSIS — T162XXA Foreign body in left ear, initial encounter: Secondary | ICD-10-CM

## 2015-04-06 NOTE — Progress Notes (Addendum)
By signing my name below, I, Rawaa Al Rifaie, attest that this documentation has been prepared under the direction and in the presence of [name], MD.  Leandra Kern, Medical Scribe. 04/06/2015.  10:38 AM.  Chief Complaint:  Chief Complaint  Patient presents with  . Ear Pain    patient state he has a cotton ball in his ear     HPI: Matthew Wilkerson is a 69 y.o. male who reports to [facility] today complaining of a left ear impaction, onset yesterday.   Pt states that after he finished showering, he used a cotton swap in his ear which he believes got stuck in there. He reports associated ear ache.    Past Medical History  Diagnosis Date  . Hyperlipidemia   . Hypertension   . Elevated PSA   . Urinary obstruction   . Hernia   . Allergy   . Prostate cancer [facility]) dx 11/2010    prostate ca,PSA=7.2 T1c volume=30cc  . Prostate CA ([place]) 01/08/12    bx=Adenocarcinoma, gleason 6 (3+3), prostate volume 43 cc  . Abnormal PSA 09/2011    7.63,   . BPH (benign prostatic hyperplasia)   . Inguinal hernia     left   . S/P radiation therapy within four to twelve weeks 08/23/12-10/18/12    prostate,7800cGy/40 sessions   Past Surgical History  Procedure Laterality Date  . Hernia repair      right inguinal done in early 70's  . Prostate biopsy  01/08/12    Adenocarcinoma,gleason=3+3=6,& 3+4=7,volume=43cc    Social History   Social History  . Marital Status: Divorced    Spouse Name: N/A  . Number of Children: 2  . Years of Education: N/A   Occupational History  . 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 Smoking status: Never Smoker   . Smokeless tobacco: Never Used  . Alcohol Use: No  . Drug Use: Yes    Special: MDMA (Ecstacy)  . Sexual Activity: Yes   Other Topics Concern  . None   Social History Narrative   Family History  Problem Relation Age of Onset  . Cancer Sister 25    pancreatic    Allergies  Allergen Reactions  . Aspirin Nausea Only    High dose asp.... He tolerates 81 mg   . Codeine Other (See Comments)    Derivatives, "causes my sinuses to hurt"   Prior to Admission medications   Medication Sig Start Date End Date Taking? Authorizing Provider  amLODipine (NORVASC) 10 MG tablet Take 1 tablet (10 mg total) by mouth daily. 08/15/14  Yes [name], MD  aspirin 81 MG tablet Take 81 mg by mouth daily.     Yes Historical Provider, MD  metoprolol succinate (TOPROL-XL) 100 MG 24 hr tablet Take 1 tablet (100 mg total) by mouth daily. Take with or immediately following a meal. 08/15/14  Yes [name], MD  rosuvastatin (CRESTOR) 5 MG tablet Take 1 tablet (5 mg total) by mouth daily. 01/24/15  Yes [name], MD     ROS: The patient has ear pain.  Patient denies fevers.  All other systems have been reviewed and were otherwise negative with the exception of those mentioned in the HPI and as above.    PHYSICAL EXAM: Filed Vitals:   04/06/15 1019  BP: 118/68  Pulse: 76  Temp: 98.2 F (36.8 C)  Resp: 17   Body mass index is 30.72 kg/(m^2).   General: Alert, no acute distress HEENT:  Normocephalic, atraumatic, oropharynx patent. Cotton foreign body on the left external auditory canal. Eye: EOMI, [facility] Cardiovascular:  Regular rate and rhythm, no rubs murmurs or gallops.  No Carotid bruits, radial pulse intact. No pedal edema.  Respiratory: Clear to auscultation bilaterally.  No wheezes, rales, or rhonchi.  No cyanosis, no use of accessory musculature Abdominal: No organomegaly, abdomen is soft and non-tender, positive bowel sounds.  No masses. Musculoskeletal: Gait intact. No edema, tenderness Skin: No rashes. Neurologic: Facial musculature symmetric. Psychiatric: Patient acts appropriately throughout our interaction. Lymphatic: No cervical or submandibular lymphadenopathy   LABS:    EKG/XRAY:      ASSESSMENT/PLAN: Cotton foreign body  removed with alleigaotor foreceps. Pt tolerated well. No further measurements necessary.    Gross sideeffects, risk and benefits, and alternatives of medications d/w patient. Patient is aware that all medications have potential sideeffects and we are unable to predict every sideeffect or drug-drug interaction that may occur. I personally performed the services described in this documentation, which was scribed in my presence. The recorded information has been reviewed and is accurate. [name] MD 04/06/2015 10:21 AM

## 2015-04-13 ENCOUNTER — Telehealth: Payer: Self-pay

## 2015-04-13 NOTE — Telephone Encounter (Signed)
Patient is asking if there is a generic for his statin.  The cost went from $40 to 52.and change.    [facility] [address]   Cell [phone]

## 2015-04-13 NOTE — Telephone Encounter (Signed)
rosuvastatin (CRESTOR) 5 MG tablet [id number]     Order Details     May need to switch to       [facility]

## 2015-04-13 NOTE — Telephone Encounter (Signed)
Yes this is the generic

## 2015-04-16 NOTE — Telephone Encounter (Signed)
He may need to be switched to simvastatin or pravastatin. Some insurances are not covering generic crestor anymore.

## 2015-04-17 MED ORDER — ATORVASTATIN CALCIUM 20 MG PO TABS: 20.0000 mg | ORAL_TABLET | Freq: Every day | ORAL | Status: DC

## 2015-04-17 NOTE — Telephone Encounter (Signed)
Spoke with pt, advised message from Dr. Everlene Farrier. Sent in Rx to pharmacy.

## 2015-04-17 NOTE — Telephone Encounter (Signed)
See if we can change him to atorvastatin. This is generic. He can take atorvastatin 20 one a day.

## 2015-07-13 ENCOUNTER — Ambulatory Visit (INDEPENDENT_AMBULATORY_CARE_PROVIDER_SITE_OTHER): Payer: Medicare Other | Admitting: Family Medicine

## 2015-07-13 VITALS — BP 118/72 | HR 120 | Temp 98.8°F | Resp 16 | Ht 68.0 in | Wt 200.0 lb

## 2015-07-13 DIAGNOSIS — H9202 Otalgia, left ear: Secondary | ICD-10-CM

## 2015-07-13 NOTE — Patient Instructions (Addendum)
You may want to buy an inexpensive rubber "bulb syringe" to wash the ears.  You have clean ears now
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
## 2015-07-19 ENCOUNTER — Ambulatory Visit: Payer: Medicare Other | Admitting: Emergency Medicine

## 2015-08-16 ENCOUNTER — Ambulatory Visit: Payer: Medicare Other | Admitting: Emergency Medicine

## 2015-08-29 ENCOUNTER — Other Ambulatory Visit: Payer: Self-pay

## 2015-08-29 MED ORDER — ATORVASTATIN CALCIUM 20 MG PO TABS: 20.0000 mg | ORAL_TABLET | Freq: Every day | ORAL | Status: DC

## 2015-09-12 ENCOUNTER — Other Ambulatory Visit: Payer: Self-pay | Admitting: Emergency Medicine

## 2015-09-21 ENCOUNTER — Other Ambulatory Visit: Payer: Self-pay | Admitting: Emergency Medicine

## 2015-10-20 ENCOUNTER — Other Ambulatory Visit: Payer: Self-pay | Admitting: Emergency Medicine

## 2015-10-25 ENCOUNTER — Other Ambulatory Visit: Payer: Self-pay | Admitting: *Deleted

## 2015-10-25 MED ORDER — METOPROLOL SUCCINATE ER 100 MG PO TB24: ORAL_TABLET | ORAL | Status: DC

## 2015-10-25 MED ORDER — AMLODIPINE BESYLATE 10 MG PO TABS: ORAL_TABLET | ORAL | Status: DC

## 2015-10-25 MED ORDER — ATORVASTATIN CALCIUM 20 MG PO TABS: 20.0000 mg | ORAL_TABLET | Freq: Every day | ORAL | Status: DC

## 2015-11-09 ENCOUNTER — Ambulatory Visit (INDEPENDENT_AMBULATORY_CARE_PROVIDER_SITE_OTHER): Payer: Medicare Other | Admitting: Emergency Medicine

## 2015-11-09 VITALS — BP 122/80 | HR 87 | Temp 98.5°F | Resp 17 | Ht 68.0 in | Wt 208.0 lb

## 2015-11-09 DIAGNOSIS — Z1159 Encounter for screening for other viral diseases: Secondary | ICD-10-CM

## 2015-11-09 DIAGNOSIS — I10 Essential (primary) hypertension: Secondary | ICD-10-CM

## 2015-11-09 DIAGNOSIS — C61 Malignant neoplasm of prostate: Secondary | ICD-10-CM

## 2015-11-09 LAB — BASIC METABOLIC PANEL WITH GFR
BUN: 19 mg/dL (ref 7–25)
CALCIUM: 9 mg/dL (ref 8.6–10.3)
CHLORIDE: 109 mmol/L (ref 98–110)
CO2: 23 mmol/L (ref 20–31)
CREATININE: 1.36 mg/dL — AB (ref 0.70–1.18)
GFR, Est African American: 60 mL/min (ref >=60)
GFR, Est Non African American: 52 mL/min — ABNORMAL LOW (ref >=60)
Glucose, Bld: 89 mg/dL (ref 65–99)
Potassium: 4 mmol/L (ref 3.5–5.3)
SODIUM: 141 mmol/L (ref 135–146)

## 2015-11-09 LAB — POCT CBC
GRANULOCYTE PERCENT: 68.7 % (ref 37–80)
HEMATOCRIT: 39.1 % — AB (ref 43.5–53.7)
HEMOGLOBIN: 13.4 g/dL — AB (ref 14.1–18.1)
LYMPH, POC: 1.8 (ref 0.6–3.4)
MCH, POC: 31.9 pg — AB (ref 27–31.2)
MCHC: 34.4 g/dL (ref 31.8–35.4)
MCV: 92.6 fL (ref 80–97)
MID (cbc): 0.2 (ref 0–0.9)
MPV: 7.9 fL (ref 0–99.8)
PLATELET COUNT, POC: 145 10*3/uL (ref 142–424)
POC GRANULOCYTE: 4.3 (ref 2–6.9)
POC LYMPH %: 28.4 % (ref 10–50)
POC MID %: 2.9 %M (ref 0–12)
RBC: 4.22 M/uL — AB (ref 4.69–6.13)
RDW, POC: 14.1 %
WBC: 6.3 10*3/uL (ref 4.6–10.2)

## 2015-11-09 LAB — TSH: TSH: 4.07 m[IU]/L (ref 0.40–4.50)

## 2015-11-09 MED ORDER — AMLODIPINE BESYLATE 10 MG PO TABS: ORAL_TABLET | ORAL | Status: DC

## 2015-11-09 MED ORDER — ATORVASTATIN CALCIUM 20 MG PO TABS: 20.0000 mg | ORAL_TABLET | Freq: Every day | ORAL | Status: DC

## 2015-11-09 MED ORDER — METOPROLOL SUCCINATE ER 100 MG PO TB24: ORAL_TABLET | ORAL | Status: DC

## 2015-11-09 NOTE — Patient Instructions (Signed)
     IF you received an x-ray today, you will receive an invoice from [facility]. Please contact [facility] at [phone] with questions or concerns regarding your invoice.   IF you received labwork today, you will receive an invoice from [facility]. Please contact [facility] at [phone] with questions or concerns regarding your invoice.   Our billing staff will not be able to assist you with questions regarding bills from these companies.  You will be contacted with the lab results as soon as they are available. The fastest way to get your results is to activate your My Chart account. Instructions are located on the last page of this paperwork. If you have not heard from us regarding the results in 2 weeks, please contact this office.      

## 2015-11-09 NOTE — Progress Notes (Addendum)
By signing my name below, I, Matthew Wilkerson, attest that this documentation has been prepared under the direction and in the presence of Matthew Ray, MD.  Electronically Signed: Thea Wilkerson, ED Scribe. 11/02/2015. 10:55 AM.  Chief Complaint:  Chief Complaint  Patient presents with  . Medication Refill    Amlodipine, Atorvastatin, Metoprolol     HPI: Matthew Wilkerson is a 70 y.o. male who reports to [facility] today for a medication refill. Pt takes Norvasc, Lipitor and Toprol. Hx of Prostate cancer, he had radiation therapy in 2014. States he missed his last appointment with Dr. [name] and is planning on rescheduling. He denies CP  He has not had colonoscopy. He is undecided at this time if he wants this done.    Immunization History  Administered Date(s) Administered  . Tdap 04/21/2009   Pt declines pneumonia and shingles vaccine  Past Medical History  Diagnosis Date  . Hyperlipidemia   . Hypertension   . Elevated PSA   . Urinary obstruction   . Hernia   . Allergy   . Prostate cancer [facility]) dx 11/2010    prostate ca,PSA=7.2 T1c volume=30cc  . Prostate CA ([place]) 01/08/12    bx=Adenocarcinoma, gleason 6 (3+3), prostate volume 43 cc  . Abnormal PSA 09/2011    7.63,   . BPH (benign prostatic hyperplasia)   . Inguinal hernia     left   . S/P radiation therapy within four to twelve weeks 08/23/12-10/18/12    prostate,7800cGy/40 sessions   Past Surgical History  Procedure Laterality Date  . Hernia repair      right inguinal done in early 70's  . Prostate biopsy  01/08/12    Adenocarcinoma,gleason=3+3=6,& 3+4=7,volume=43cc    Social History   Social History  . Marital Status: Divorced    Spouse Name: N/A  . Number of Children: 2  . Years of Education: N/A   Occupational History  . 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 Smoking status: Never Smoker   . Smokeless tobacco: Never Used  . Alcohol  Use: No  . Drug Use: Yes    Special: MDMA (Ecstacy)  . Sexual Activity: Yes   Other Topics Concern  . None   Social History Narrative   Family History  Problem Relation Age of Onset  . Cancer Sister 49    pancreatic   Allergies  Allergen Reactions  . Aspirin Nausea Only    High dose asp.... He tolerates 81 mg   . Codeine Other (See Comments)    Derivatives, "causes my sinuses to hurt"   Prior to Admission medications   Medication Sig Start Date End Date Taking? Authorizing Provider  amLODipine (NORVASC) 10 MG tablet TAKE 1 TABLET (10 MG TOTAL) BY MOUTH DAILY.  (Needs office visit) 10/25/15  Yes Matthew Russian, MD  aspirin 81 MG tablet Take 81 mg by mouth daily.     Yes Historical Provider, MD  atorvastatin (LIPITOR) 20 MG tablet Take 1 tablet (20 mg total) by mouth daily. 10/25/15  Yes Matthew Russian, MD  metoprolol succinate (TOPROL-XL) 100 MG 24 hr tablet TAKE 1 TABLET (100 MG TOTAL) BY MOUTH DAILY. TAKE WITH OR IMMEDIATELY FOLLOWING A MEAL. 10/25/15  Yes Matthew Russian, MD   ROS: The patient denies fevers, chills, night sweats, unintentional weight loss, chest pain, palpitations, wheezing, dyspnea on exertion, nausea, vomiting, abdominal pain, dysuria, hematuria, melena, numbness, weakness,  or tingling.   All other systems have been reviewed and were otherwise negative with the exception of those mentioned in the HPI and as above.    PHYSICAL EXAM: Filed Vitals:   11/09/15 1051  BP: 122/80  Pulse: 87  Temp: 98.5 F (36.9 C)  Resp: 17   Body mass index is 31.63 kg/(m^2).   General: Alert, no acute distress HEENT:  Normocephalic, atraumatic, oropharynx patent. Eye: [name] [facility] Cardiovascular:  Regular rate and rhythm, no rubs murmurs or gallops.  No Carotid bruits, radial pulse intact. No pedal edema.  Respiratory: Clear to auscultation bilaterally.  No wheezes, rales, or rhonchi.  No cyanosis, no use of accessory musculature Abdominal: No organomegaly, abdomen is soft  and non-tender, positive bowel sounds.  No masses. 1 cm reducible inguinal hernia.  Musculoskeletal: Gait intact. No edema, tenderness Skin: No rashes. Pigment changes on left shin Neurologic: Facial musculature symmetric. Psychiatric: Patient acts appropriately throughout our interaction. Lymphatic: No cervical or submandibular lymphadenopathy   ASSESSMENT/PLAN: Patient declined having a colonoscopy. He declined sending the stool cards. He declined cold guard He declined any vaccinations. He does not want pneumonia vaccine or shingles vaccine. He missed his appointment with Dr. [name]  his prostate cancer physician and will reschedule this appointment. Meds were refilled today routine labs were done.I personally performed the services described in this documentation, which was scribed in my presence. The recorded information has been reviewed and is accurate.   Gross sideeffects, risk and benefits, and alternatives of medications d/w patient. Patient is aware that all medications have potential sideeffects and we are unable to predict every sideeffect or drug-drug interaction that may occur.  [name] MD 11/09/2015 10:54 AM

## 2015-11-10 LAB — HEPATITIS C ANTIBODY: HCV AB: NEGATIVE

## 2016-06-07 ENCOUNTER — Ambulatory Visit (INDEPENDENT_AMBULATORY_CARE_PROVIDER_SITE_OTHER): Payer: Medicare Other | Admitting: Physician Assistant

## 2016-06-07 DIAGNOSIS — M542 Cervicalgia: Secondary | ICD-10-CM | POA: Diagnosis not present

## 2016-06-07 DIAGNOSIS — M545 Low back pain, unspecified: Secondary | ICD-10-CM
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
## 2016-06-07 NOTE — Patient Instructions (Addendum)
Ice for the next 24-48 hours then heat Do gentle stretches and range of motion to prevent the knee from getting more stiff. Continue the motrin as this will help with the pain. Recheck if things do not get better.  IF you received an x-ray today, you will receive an invoice from [facility]. Please contact [facility] at [phone] with questions or concerns regarding your invoice.   IF you received labwork today, you will receive an invoice from [facility]. Please contact [facility] at [phone] with questions or concerns regarding your invoice.   Our billing staff will not be able to assist you with questions regarding bills from these companies.  You will be contacted with the lab results as soon as they are available. The fastest way to get your results is to activate your My Chart account. Instructions are located on the last page of this paperwork. If you have not heard from [place] regarding the results in 2 weeks, please contact this office.

## 2016-06-13 ENCOUNTER — Ambulatory Visit (INDEPENDENT_AMBULATORY_CARE_PROVIDER_SITE_OTHER): Payer: Medicare Other | Admitting: Physician Assistant

## 2016-06-13 DIAGNOSIS — M545 Low back pain, unspecified: Secondary | ICD-10-CM

## 2016-06-13 DIAGNOSIS — M7989 Other specified soft tissue disorders: Secondary | ICD-10-CM | POA: Diagnosis not present

## 2016-06-13 DIAGNOSIS — S46812D Strain of other muscles, fascia and tendons at shoulder and upper arm level, left arm, subsequent encounter: Secondary | ICD-10-CM | POA: Diagnosis not present

## 2016-06-13 MED ORDER — MELOXICAM 7.5 MG PO TABS: 7.5000 mg | ORAL_TABLET | Freq: Every day | ORAL | 0 refills | Status: DC
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
## 2016-06-13 NOTE — Patient Instructions (Addendum)
  Heat and gentle stretchs to the back Recheck in a week if you are not improved or well. Expect the hand swelling to go down as well as the pain in the lower back to improve. Take the mobic to help with the pain and swelling - it is ok to use tylenol with this medication but not additional motrin or aleve   IF you received an x-ray today, you will receive an invoice from [facility]. Please contact [facility] at [phone] with questions or concerns regarding your invoice.   IF you received labwork today, you will receive an invoice from [facility]. Please contact [facility] at [phone] with questions or concerns regarding your invoice.   Our billing staff will not be able to assist you with questions regarding bills from these companies.  You will be contacted with the lab results as soon as they are available. The fastest way to get your results is to activate your My Chart account. Instructions are located on the last page of this paperwork. If you have not heard from [place] regarding the results in 2 weeks, please contact this office.

## 2016-06-21 ENCOUNTER — Ambulatory Visit (INDEPENDENT_AMBULATORY_CARE_PROVIDER_SITE_OTHER): Payer: Medicare Other | Admitting: Urgent Care

## 2016-06-21 ENCOUNTER — Ambulatory Visit (INDEPENDENT_AMBULATORY_CARE_PROVIDER_SITE_OTHER): Payer: Medicare Other

## 2016-06-21 DIAGNOSIS — M545 Low back pain, unspecified: Secondary | ICD-10-CM

## 2016-06-21 DIAGNOSIS — M25561 Pain in right knee: Secondary | ICD-10-CM | POA: Diagnosis not present

## 2016-06-21 DIAGNOSIS — M5136 Other intervertebral disc degeneration, lumbar region: Secondary | ICD-10-CM | POA: Diagnosis not present

## 2016-06-21 DIAGNOSIS — M25562 Pain in left knee: Secondary | ICD-10-CM

## 2016-06-21 DIAGNOSIS — M79642 Pain in left hand: Secondary | ICD-10-CM

## 2016-06-21 DIAGNOSIS — M7989 Other specified soft tissue disorders: Secondary | ICD-10-CM

## 2016-06-21 MED ORDER — CYCLOBENZAPRINE HCL 5 MG PO TABS: 5.0000 mg | ORAL_TABLET | Freq: Three times a day (TID) | ORAL | 1 refills | Status: DC | PRN

## 2016-06-21 NOTE — Patient Instructions (Addendum)
Degenerative Disk Disease Degenerative disk disease is a condition caused by the changes that occur in spinal disks as you grow older. Spinal disks are soft and compressible disks located between the bones of your spine (vertebrae). These disks act like shock absorbers. Degenerative disk disease can affect the whole spine. However, the neck and lower back are most commonly affected. Many changes can occur in the spinal disks with aging, such as:  The spinal disks may dry and shrink.  Small tears may occur in the tough, outer covering of the disk (annulus).  The disk space may become smaller due to loss of water.  Abnormal growths in the bone (spurs) may occur. This can put pressure on the nerve roots exiting the spinal canal, causing pain.  The spinal canal may become narrowed. What increases the risk?  Being overweight.  Having a family history of degenerative disk disease.  Smoking.  There is increased risk if you are doing heavy lifting or have a sudden injury. What are the signs or symptoms? Symptoms vary from person to person and may include:  Pain that varies in intensity. Some people have no pain, while others have severe pain. The location of the pain depends on the part of your backbone that is affected.  You will have neck or arm pain if a disk in the neck area is affected.  You will have pain in your back, buttocks, or legs if a disk in the lower back is affected.  Pain that becomes worse while bending, reaching up, or with twisting movements.  Pain that may start gradually and then get worse as time passes. It may also start after a major or minor injury.  Numbness or tingling in the arms or legs. How is this diagnosed? Your health care provider will ask you about your symptoms and about activities or habits that may cause the pain. He or she may also ask about any injuries, diseases, or treatments you have had. Your health care provider will examine you to check for  the range of movement that is possible in the affected area, to check for strength in your extremities, and to check for sensation in the areas of the arms and legs supplied by different nerve roots. You may also have:  An X-ray of the spine.  Other imaging tests, such as MRI. How is this treated? Your health care provider will advise you on the best plan for treatment. Treatment may include:  Medicines.  Rehabilitation exercises. Follow these instructions at home:  Follow proper lifting and walking techniques as advised by your health care provider.  Maintain good posture.  Exercise regularly as advised by your health care provider.  Perform relaxation exercises.  Change your sitting, standing, and sleeping habits as advised by your health care provider.  Change positions frequently.  Lose weight or maintain a healthy weight as advised by your health care provider.  Do not use any tobacco products, including cigarettes, chewing tobacco, or electronic cigarettes. If you need help quitting, ask your health care provider.  Wear supportive footwear.  Take medicines only as directed by your health care provider. Contact a health care provider if:  Your pain does not go away within 1-4 weeks.  You have significant appetite or weight loss. Get help right away if:  Your pain is severe.  You notice weakness in your arms, hands, or legs.  You begin to lose control of your bladder or bowel movements.  You have fevers or night sweats.   This information is not intended to replace advice given to you by your health care provider. Make sure you discuss any questions you have with your health care provider. Document Released: 02/02/2007 Document Revised: 09/13/2015 Document Reviewed: 08/09/2013 Elsevier Interactive Patient Education  2017 [employer]     IF you received an x-ray today, you will receive an invoice from [facility]. Please contact [facility]  at [phone] with questions or concerns regarding your invoice.   IF you received labwork today, you will receive an invoice from [facility]. Please contact [facility] at [phone] with questions or concerns regarding your invoice.   Our billing staff will not be able to assist you with questions regarding bills from these companies.  You will be contacted with the lab results as soon as they are available. The fastest way to get your results is to activate your My Chart account. Instructions are located on the last page of this paperwork. If you have not heard from us regarding the results in 2 weeks, please contact this office.     
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
## 2016-07-01 ENCOUNTER — Telehealth: Payer: Self-pay | Admitting: Urgent Care

## 2016-07-01 DIAGNOSIS — M545 Low back pain, unspecified: Secondary | ICD-10-CM

## 2016-07-01 DIAGNOSIS — M2550 Pain in unspecified joint: Secondary | ICD-10-CM

## 2016-07-01 NOTE — Telephone Encounter (Signed)
Pt stopped by and requested that he be set up for him,he has been Seen for same(back)    Best phone for pt is [phone]

## 2016-07-01 NOTE — Telephone Encounter (Signed)
Matthew Wilkerson,   Patient states he is still having pain and would like a referral he doesn't know who he should see ortho or just start PT and stated he would leave that up to you.     He states he will see anyone but the only day he cannot go would be a Wednesday.

## 2016-07-03 NOTE — Telephone Encounter (Signed)
I tried calling patient. His VM is not set up and could not leave a voice message. Please inform patient that I have placed a referral to ortho for his multiple joint and back pain. In the meantime, please have patient use meloxicam daily with Flexeril.

## 2016-07-04 NOTE — Telephone Encounter (Signed)
Notified patient of Mr. Wilburn Cornelia message.

## 2016-07-14 ENCOUNTER — Ambulatory Visit (INDEPENDENT_AMBULATORY_CARE_PROVIDER_SITE_OTHER): Payer: Medicare Other | Admitting: Orthopaedic Surgery

## 2016-07-29 ENCOUNTER — Ambulatory Visit (INDEPENDENT_AMBULATORY_CARE_PROVIDER_SITE_OTHER): Payer: Medicare Other | Admitting: Orthopaedic Surgery

## 2016-09-20 ENCOUNTER — Ambulatory Visit (INDEPENDENT_AMBULATORY_CARE_PROVIDER_SITE_OTHER): Payer: Medicare Other | Admitting: Physician Assistant

## 2016-09-20 ENCOUNTER — Encounter: Payer: Self-pay | Admitting: Physician Assistant

## 2016-09-20 VITALS — BP 133/80 | HR 73 | Temp 98.1°F | Resp 16 | Ht 67.0 in | Wt 204.4 lb

## 2016-09-20 DIAGNOSIS — T162XXA Foreign body in left ear, initial encounter: Secondary | ICD-10-CM

## 2016-09-20 NOTE — Patient Instructions (Signed)
     IF you received an x-ray today, you will receive an invoice from [facility]. Please contact [facility] at [phone] with questions or concerns regarding your invoice.   IF you received labwork today, you will receive an invoice from [facility]. Please contact [facility] at [phone] with questions or concerns regarding your invoice.   Our billing staff will not be able to assist you with questions regarding bills from these companies.  You will be contacted with the lab results as soon as they are available. The fastest way to get your results is to activate your My Chart account. Instructions are located on the last page of this paperwork. If you have not heard from us regarding the results in 2 weeks, please contact this office.     

## 2016-09-23 NOTE — Progress Notes (Signed)
PRIMARY CARE AT [place], [place] [address] [phone]  Date:  09/20/2016   Name:  Matthew Wilkerson   DOB:  19-May-1945   MRN:  [id number]  PCP:  Shawnee Knapp, MD    History of Present Illness:  Matthew Wilkerson is a 71 y.o. male patient who presents to PCP with  Chief Complaint  Patient presents with  . Ear Problem    left ear feels clogged      Patient feels that his ear is clogged.  He was cleaning his ear out from water in the shower, with a qtip.  He never noticed his ear feeling worsening clog. He thought none of the qtip was gone when he removed it.  Patient Active Problem List   Diagnosis Date Noted  . Other and unspecified hyperlipidemia 08/17/2012  . Headache(784.0) 08/17/2012  . Prostate cancer ([place]) 01/08/2012  . HTN (hypertension) 08/01/2011  . Left inguinal hernia 11/20/2010    Past Medical History:  Diagnosis Date  . Abnormal PSA 09/2011   7.63,   . Allergy   . BPH (benign prostatic hyperplasia)   . Elevated PSA   . Hernia   . Hyperlipidemia   . Hypertension   . Inguinal hernia    left   . Prostate CA ([place]) 01/08/12   bx=Adenocarcinoma, gleason 6 (3+3), prostate volume 43 cc  . Prostate cancer [facility]) dx 11/2010   prostate ca,PSA=7.2 T1c volume=30cc  . S/P radiation therapy within four to twelve weeks 08/23/12-10/18/12   prostate,7800cGy/40 sessions  . Urinary obstruction     Past Surgical History:  Procedure Laterality Date  . HERNIA REPAIR     right inguinal done in early 70's  . PROSTATE BIOPSY  01/08/12   Adenocarcinoma,gleason=3+3=6,& 3+4=7,volume=43cc     Social History  Substance Use Topics  . Smoking status: Never Smoker  . Smokeless tobacco: Never Used  . Alcohol use No    Family History  Problem Relation Age of Onset  . Cancer Sister 15       pancreatic    Allergies  Allergen Reactions  . Aspirin Nausea Only    High dose asp.... He tolerates 81 mg   . Codeine Other (See Comments)    Derivatives, "causes my  sinuses to hurt"    Medication list has been reviewed and updated.  Current Outpatient Prescriptions on File Prior to Visit  Medication Sig Dispense Refill  . amLODipine (NORVASC) 10 MG tablet TAKE 1 TABLET (10 MG TOTAL) BY MOUTH DAILY. 90 tablet 3  . aspirin 81 MG tablet Take 81 mg by mouth daily.      Marland Kitchen atorvastatin (LIPITOR) 20 MG tablet Take 1 tablet (20 mg total) by mouth daily. 90 tablet 3  . cyclobenzaprine (FLEXERIL) 5 MG tablet Take 1 tablet (5 mg total) by mouth 3 (three) times daily as needed. 60 tablet 1  . meloxicam (MOBIC) 7.5 MG tablet Take 1 tablet (7.5 mg total) by mouth daily. 30 tablet 0  . metoprolol succinate (TOPROL-XL) 100 MG 24 hr tablet TAKE 1 TABLET (100 MG TOTAL) BY MOUTH DAILY. TAKE WITH OR IMMEDIATELY FOLLOWING A MEAL. 90 tablet 3   No current facility-administered medications on file prior to visit.     ROS ROS otherwise unremarkable unless listed above.  Physical Examination: BP 133/80   Pulse 73   Temp 98.1 F (36.7 C) (Oral)   Resp 16   Ht 5\' 7"  (1.702 m)   Wt 204 lb 6.4 oz (  92.7 kg)   SpO2 97%   BMI 32.01 kg/m  Ideal Body Weight: Weight in (lb) to have BMI = 25: 159.3  Physical Exam  Constitutional: He is oriented to person, place, and time. He appears well-developed and well-nourished. No distress.  HENT:  Head: Normocephalic and atraumatic.  Left ear with cotton blocking visualization of the tm.  Once irrigated, this was normal.  Cardiovascular: Normal rate.   Pulmonary/Chest: Effort normal. No respiratory distress.  Neurological: He is alert and oriented to person, place, and time.  Skin: Skin is warm and dry. He is not diaphoretic.  Psychiatric: He has a normal mood and affect. His behavior is normal.     Assessment and Plan: Matthew Wilkerson is a 71 y.o. male who is here today for cerumen impaction. Foreign body removed.  Ear foreign body, left, initial encounter  Ivar Drape, PA-C [facility] 6/5/20187:47 AM

## 2016-10-21 ENCOUNTER — Ambulatory Visit: Payer: Medicare Other | Admitting: Physician Assistant

## 2016-10-24 ENCOUNTER — Ambulatory Visit (INDEPENDENT_AMBULATORY_CARE_PROVIDER_SITE_OTHER): Payer: Medicare Other | Admitting: Physician Assistant

## 2016-10-24 ENCOUNTER — Encounter: Payer: Self-pay | Admitting: Physician Assistant

## 2016-10-24 VITALS — BP 144/77 | HR 78 | Temp 98.2°F | Resp 18 | Ht 67.0 in | Wt 206.0 lb

## 2016-10-24 DIAGNOSIS — R21 Rash and other nonspecific skin eruption: Secondary | ICD-10-CM

## 2016-10-24 DIAGNOSIS — L03115 Cellulitis of right lower limb: Secondary | ICD-10-CM | POA: Diagnosis not present

## 2016-10-24 DIAGNOSIS — R600 Localized edema: Secondary | ICD-10-CM | POA: Diagnosis not present

## 2016-10-24 DIAGNOSIS — L03116 Cellulitis of left lower limb: Secondary | ICD-10-CM | POA: Diagnosis not present

## 2016-10-24 DIAGNOSIS — L299 Pruritus, unspecified: Secondary | ICD-10-CM | POA: Diagnosis not present

## 2016-10-24 LAB — CBC WITH DIFFERENTIAL/PLATELET
BASOS: 0 %
Basophils Absolute: 0 10*3/uL (ref 0.0–0.2)
EOS (ABSOLUTE): 0.6 10*3/uL — ABNORMAL HIGH (ref 0.0–0.4)
EOS: 8 %
HEMATOCRIT: 41.4 % (ref 37.5–51.0)
HEMOGLOBIN: 13.6 g/dL (ref 13.0–17.7)
IMMATURE GRANS (ABS): 0 10*3/uL (ref 0.0–0.1)
IMMATURE GRANULOCYTES: 0 %
Lymphocytes Absolute: 1.5 10*3/uL (ref 0.7–3.1)
Lymphs: 22 %
MCH: 30.2 pg (ref 26.6–33.0)
MCHC: 32.9 g/dL (ref 31.5–35.7)
MCV: 92 fL (ref 79–97)
MONOCYTES: 9 %
MONOS ABS: 0.6 10*3/uL (ref 0.1–0.9)
NEUTROS PCT: 61 %
Neutrophils Absolute: 4.3 10*3/uL (ref 1.4–7.0)
Platelets: 198 10*3/uL (ref 150–379)
RBC: 4.5 x10E6/uL (ref 4.14–5.80)
RDW: 14.6 % (ref 12.3–15.4)
WBC: 7 10*3/uL (ref 3.4–10.8)

## 2016-10-24 LAB — CMP14+EGFR
ALBUMIN: 4.5 g/dL (ref 3.5–4.8)
ALT: 33 IU/L (ref 0–44)
AST: 30 IU/L (ref 0–40)
Albumin/Globulin Ratio: 1.5 (ref 1.2–2.2)
Alkaline Phosphatase: 127 IU/L — ABNORMAL HIGH (ref 39–117)
BUN / CREAT RATIO: 20 (ref 10–24)
BUN: 25 mg/dL (ref 8–27)
Bilirubin Total: 0.4 mg/dL (ref 0.0–1.2)
CO2: 21 mmol/L (ref 20–29)
CREATININE: 1.24 mg/dL (ref 0.76–1.27)
Calcium: 9.9 mg/dL (ref 8.6–10.2)
Chloride: 105 mmol/L (ref 96–106)
GFR calc non Af Amer: 58 mL/min/{1.73_m2} — ABNORMAL LOW (ref >59)
GFR, EST AFRICAN AMERICAN: 67 mL/min/{1.73_m2} (ref >59)
GLUCOSE: 90 mg/dL (ref 65–99)
Globulin, Total: 3.1 g/dL (ref 1.5–4.5)
Potassium: 4.3 mmol/L (ref 3.5–5.2)
Sodium: 142 mmol/L (ref 134–144)
TOTAL PROTEIN: 7.6 g/dL (ref 6.0–8.5)

## 2016-10-24 LAB — SEDIMENTATION RATE: Sed Rate: 38 mm/hr — ABNORMAL HIGH (ref 0–30)

## 2016-10-24 MED ORDER — CEPHALEXIN 500 MG PO CAPS: 500.0000 mg | ORAL_CAPSULE | Freq: Four times a day (QID) | ORAL | 0 refills | Status: DC

## 2016-10-24 MED ORDER — HYDROXYZINE HCL 25 MG PO TABS: 12.5000 mg | ORAL_TABLET | Freq: Three times a day (TID) | ORAL | 0 refills | Status: DC | PRN

## 2016-10-24 NOTE — Patient Instructions (Addendum)
  Keep the dressing on your leg dry.  Elevated the foot to help with the swelling.  Use the medications for itching only when you are going to be at home so if it makes you sleepy your are safe.  Only use it when the itching is really bad and start with a 1/2 pill.   IF you received an x-ray today, you will receive an invoice from [facility]. Please contact [facility] at [phone] with questions or concerns regarding your invoice.   IF you received labwork today, you will receive an invoice from [facility]. Please contact [facility] at [phone] with questions or concerns regarding your invoice.   Our billing staff will not be able to assist you with questions regarding bills from these companies.  You will be contacted with the lab results as soon as they are available. The fastest way to get your results is to activate your My Chart account. Instructions are located on the last page of this paperwork. If you have not heard from [place] regarding the results in 2 weeks, please contact this office.

## 2016-10-24 NOTE — Progress Notes (Signed)
Matthew Wilkerson  MRN: [id number] DOB: April 16, 1946  PCP: Shawnee Knapp, MD  Chief Complaint  Patient presents with  . Rash    rash on both legs and arms and has seen Dr. Everlene Farrier for this issue before     Subjective:  Pt presents to clinic for rash on both legs for about a month - started on the right leg but now the left legs also has the rash.  It hurts and itches.  He thinks that it might have started after a scratch to his left medial calf area - though he is not completely sure.  The rash on his body itches and he has had nothing in his diet, medications or environment that have changed.  This did start after the redness in his right left started.  The redness in his right leg has continued to get worse and now the leg is starting to swell and hurt more. Now the left leg is also having similar symptoms. He has done nothing to either area.  He did have something similar to his rash in 2014 which took months to control and he ended up with ID for treatment.  Review of Systems  Constitutional: Negative for chills and fever.  Cardiovascular: Positive for leg swelling (does reduce slightly during the night but not completely).  Skin: Positive for rash.    Patient Active Problem List   Diagnosis Date Noted  . Other and unspecified hyperlipidemia 08/17/2012  . Headache(784.0) 08/17/2012  . Prostate cancer ([place]) 01/08/2012  . HTN (hypertension) 08/01/2011  . Left inguinal hernia 11/20/2010    Current Outpatient Prescriptions on File Prior to Visit  Medication Sig Dispense Refill  . amLODipine (NORVASC) 10 MG tablet TAKE 1 TABLET (10 MG TOTAL) BY MOUTH DAILY. 90 tablet 3  . aspirin 81 MG tablet Take 81 mg by mouth daily.      . cyclobenzaprine (FLEXERIL) 5 MG tablet Take 1 tablet (5 mg total) by mouth 3 (three) times daily as needed. 60 tablet 1  . meloxicam (MOBIC) 7.5 MG tablet Take 1 tablet (7.5 mg total) by mouth daily. 30 tablet 0  . metoprolol succinate (TOPROL-XL) 100 MG 24 hr  tablet TAKE 1 TABLET (100 MG TOTAL) BY MOUTH DAILY. TAKE WITH OR IMMEDIATELY FOLLOWING A MEAL. 90 tablet 3  . atorvastatin (LIPITOR) 20 MG tablet Take 1 tablet (20 mg total) by mouth daily. (Patient not taking: Reported on 10/24/2016) 90 tablet 3   No current facility-administered medications on file prior to visit.     Allergies  Allergen Reactions  . Aspirin Nausea Only    High dose asp.... He tolerates 81 mg   . Codeine Other (See Comments)    Derivatives, "causes my sinuses to hurt"    Pt patients past, family and social history were reviewed and updated.   Objective:  BP (!) 144/77   Pulse 78   Temp 98.2 F (36.8 C) (Oral)   Resp 18   Ht _0  (1.702 m)   Wt 206 lb (93.4 kg)   SpO2 97%   BMI 32.26 kg/m   Physical Exam  Constitutional: He is oriented to person, place, and time and well-developed, well-nourished, and in no distress.  HENT:  Head: Normocephalic and atraumatic.  Right Ear: External ear normal.  Left Ear: External ear normal.  Eyes: Conjunctivae are normal.  Neck: Normal range of motion.  Cardiovascular: Normal rate, regular rhythm and normal heart sounds.   No murmur heard. Pulmonary/Chest: Effort  normal and breath sounds normal. He has no wheezes.  Musculoskeletal:       Right lower leg: He exhibits edema.       Left lower leg: He exhibits edema.  DP pulses palpable bilaterally Right lower leg - erythema 2/3 of distal calf - worse on the medial edge - edema up almost the entire lower leg involving the foot - the erythema moves some on the medial aspect of the foot Left leg leg - erythema is less - involving less area and not as severe - swelling is present in entire lower leg but not as bad as the right leg  Neurological: He is alert and oriented to person, place, and time. Gait normal.  Skin: Skin is warm and dry.  Maculopapular rash on torso and arms and mild on the legs - none on the face Bilateral legs - at upper end of erythema vasculitic changes  with petechia present  Psychiatric: Mood, memory, affect and judgment normal.   Results for orders placed or performed in visit on 10/24/16  CBC with Differential/Platelet  Result Value Ref Range   WBC 7.0 3.4 - 10.8 x10E3/uL   RBC 4.50 4.14 - 5.80 x10E6/uL   Hemoglobin 13.6 13.0 - 17.7 g/dL   Hematocrit 41.4 37.5 - 51.0 %   MCV 92 79 - 97 fL   MCH 30.2 26.6 - 33.0 pg   MCHC 32.9 31.5 - 35.7 g/dL   RDW 14.6 12.3 - 15.4 %   Platelets 198 150 - 379 x10E3/uL   Neutrophils 61 Not Estab. %   Lymphs 22 Not Estab. %   Monocytes 9 Not Estab. %   Eos 8 Not Estab. %   Basos 0 Not Estab. %   Neutrophils Absolute 4.3 1.4 - 7.0 x10E3/uL   Lymphocytes Absolute 1.5 0.7 - 3.1 x10E3/uL   Monocytes Absolute 0.6 0.1 - 0.9 x10E3/uL   EOS (ABSOLUTE) 0.6 (H) 0.0 - 0.4 x10E3/uL   Basophils Absolute 0.0 0.0 - 0.2 x10E3/uL   Immature Granulocytes 0 Not Estab. %   Immature Grans (Abs) 0.0 0.0 - 0.1 x10E3/uL  CMP14+EGFR  Result Value Ref Range   Glucose 90 65 - 99 mg/dL   BUN 25 8 - 27 mg/dL   Creatinine, Ser 1.24 0.76 - 1.27 mg/dL   GFR calc non Af Amer 58 (L) >59 mL/min/1.73   GFR calc Af Amer 67 >59 mL/min/1.73   BUN/Creatinine Ratio 20 10 - 24   Sodium 142 134 - 144 mmol/L   Potassium 4.3 3.5 - 5.2 mmol/L   Chloride 105 96 - 106 mmol/L   CO2 21 20 - 29 mmol/L   Calcium 9.9 8.6 - 10.2 mg/dL   Total Protein 7.6 6.0 - 8.5 g/dL   Albumin 4.5 3.5 - 4.8 g/dL   Globulin, Total 3.1 1.5 - 4.5 g/dL   Albumin/Globulin Ratio 1.5 1.2 - 2.2   Bilirubin Total 0.4 0.0 - 1.2 mg/dL   Alkaline Phosphatase 127 (H) 39 - 117 IU/L   AST 30 0 - 40 IU/L   ALT 33 0 - 44 IU/L  Sedimentation rate  Result Value Ref Range   Sed Rate 38 (H) 0 - 30 mm/hr    Assessment and Plan :  Cellulitis of leg, left - Plan: cephALEXin (KEFLEX) 500 MG capsule  Cellulitis of leg, right - Plan: cephALEXin (KEFLEX) 500 MG capsule  Rash - Plan: CBC with Differential/Platelet, CMP14+EGFR, Sedimentation rate  Pruritus - Plan:  hydrOXYzine (ATARAX/VISTARIL) 25 MG tablet  Bilateral lower extremity edema   Unna boot placed to the right lower leg - pt to keep dry - start Keflex - treat itching - recheck on 7/9 with Dr Brigitte Pulse for Louretta Parma boot removal and recheck infection  Saw with Dr Jonna Clark PA-C  [facility] 10/27/2016 9:31 AM

## 2016-10-27 ENCOUNTER — Ambulatory Visit (INDEPENDENT_AMBULATORY_CARE_PROVIDER_SITE_OTHER): Payer: Medicare Other | Admitting: Family Medicine

## 2016-10-27 ENCOUNTER — Encounter: Payer: Self-pay | Admitting: Family Medicine

## 2016-10-27 VITALS — BP 137/87 | HR 76 | Temp 97.9°F | Resp 18 | Ht 67.0 in | Wt 207.4 lb

## 2016-10-27 DIAGNOSIS — L03119 Cellulitis of unspecified part of limb: Secondary | ICD-10-CM | POA: Diagnosis not present

## 2016-10-27 DIAGNOSIS — L02419 Cutaneous abscess of limb, unspecified: Secondary | ICD-10-CM | POA: Diagnosis not present

## 2016-10-27 DIAGNOSIS — L500 Allergic urticaria: Secondary | ICD-10-CM

## 2016-10-27 MED ORDER — SULFAMETHOXAZOLE-TRIMETHOPRIM 800-160 MG PO TABS: 1.0000 | ORAL_TABLET | Freq: Two times a day (BID) | ORAL | 0 refills | Status: DC

## 2016-10-27 MED ORDER — PREDNISONE 20 MG PO TABS: ORAL_TABLET | ORAL | 0 refills | Status: DC

## 2016-10-27 NOTE — Patient Instructions (Addendum)
pc    IF you received an x-ray today, you will receive an invoice from [facility] Rad[facility]. Please contact [facility] at [phone] with questions or concerns regarding your invoice.   IF you received labwork today, you will receive an invoice from [facility]. Please contact [facility] at [phone] with questions or concerns regarding your invoice.   Our billing staff will not be able to assist you with questions regarding bills from these companies.  You will be contacted with the lab results as soon as they are available. The fastest way to get your results is to activate your My Chart account. Instructions are located on the last page of this paperwork. If you have not heard from [place] regarding the results in 2 weeks, please contact this office.     Hives Hives (urticaria) are itchy, red, swollen areas on your skin. Hives can appear on any part of your body and can vary in size. They can be as small as the tip of a pen or much larger. Hives often fade within 24 hours (acute hives). In other cases, new hives appear after old ones fade. This cycle can continue for several days or weeks (chronic hives). Hives result from your body's reaction to an irritant or to something that you are allergic to (trigger). When you are exposed to a trigger, your body releases a chemical (histamine) that causes redness, itching, and swelling. You can get hives immediately after being exposed to a trigger or hours later. Hives do not spread from person to person (are not contagious). Your hives may get worse with scratching, exercise, and emotional stress. What are the causes? Causes of this condition include:  Allergies to certain foods or ingredients.  Insect bites or stings.  Exposure to pollen or pet dander.  Contact with latex or chemicals.  Spending time in sunlight, heat, or cold (exposure).  Exercise.  Stress.  You can also get hives from some medical conditions and treatments.  These include:  Viruses, including the common cold.  Bacterial infections, such as urinary tract infections and strep throat.  Disorders such as vasculitis, lupus, or thyroid disease.  Certain medications.  Allergy shots.  Blood transfusions.  Sometimes, the cause of hives is not known (idiopathic hives). What increases the risk? This condition is more likely to develop in:  Women.  People who have food allergies, especially to citrus fruits, milk, eggs, peanuts, tree nuts, or shellfish.  People who are allergic to: ? Medicines. ? Latex. ? Insects. ? Animals. ? Pollen.  People who have certain medical conditions, includinglupus or thyroid disease.  What are the signs or symptoms? The main symptom of this condition is raised, itchyred or white bumps or patches on your skin. These areas may:  Become large and swollen (welts).  Change in shape and location, quickly and repeatedly.  Be separate hives or connect over a large area of skin.  Sting or become painful.  Turn white when pressed in the center (blanch).  In severe cases, yourhands, feet, and face may also become swollen. This may occur if hives develop deeper in your skin. How is this diagnosed? This condition is diagnosed based on your symptoms, medical history, and physical exam. Your skin, urine, or blood may be tested to find out what is causing your hives and to rule out other health issues. Your health care provider may also remove a small sample of skin from the affected area and examine it under a microscope (biopsy). How is this treated?  Treatment depends on the severity of your condition. Your health care provider may recommend using cool, wet cloths (cool compresses) or taking cool showers to relieve itching. Hives are sometimes treated with medicines, including:  Antihistamines.  Corticosteroids.  Antibiotics.  An injectable medicine (omalizumab). Your health care provider may prescribe this  if you have chronic idiopathic hives and you continue to have symptoms even after treatment with antihistamines.  Severe cases may require an emergency injection of adrenaline (epinephrine) to prevent a life-threatening allergic reaction (anaphylaxis). Follow these instructions at home: Medicines  Take or apply over-the-counter and prescription medicines only as told by your health care provider.  If you were prescribed an antibiotic medicine, use it as told by your health care provider. Do not stop taking the antibiotic even if you start to feel better. Skin Care  Apply cool compresses to the affected areas.  Do not scratch or rub your skin. General instructions  Do not take hot showers or baths. This can make itching worse.  Do not wear tight-fitting clothing.  Use sunscreen and wear protective clothing when you are outside.  Avoid any substances that cause your hives. Keep a journal to help you track what causes your hives. Write down: ? What medicines you take. ? What you eat and drink. ? What products you use on your skin.  Keep all follow-up visits as told by your health care provider. This is important. Contact a health care provider if:  Your symptoms are not controlled with medicine.  Your joints are painful or swollen. Get help right away if:  You have a fever.  You have pain in your abdomen.  Your tongue or lips are swollen.  Your eyelids are swollen.  Your chest or throat feels tight.  You have trouble breathing or swallowing. These symptoms may represent a serious problem that is an emergency. Do not wait to see if the symptoms will go away. Get medical help right away. Call your local emergency services (911 in the [place] Do not drive yourself to the hospital. This information is not intended to replace advice given to you by your health care provider. Make sure you discuss any questions you have with your health care provider. Document Released:  04/07/2005 Document Revised: 09/05/2015 Document Reviewed: 01/24/2015 Elsevier Interactive Patient Education  2017 [employer]  Cellulitis, Adult Cellulitis is a skin infection. The infected area is usually red and tender. This condition occurs most often in the arms and lower legs. The infection can travel to the muscles, blood, and underlying tissue and become serious. It is very important to get treated for this condition. What are the causes? Cellulitis is caused by bacteria. The bacteria enter through a break in the skin, such as a cut, burn, insect bite, open sore, or crack. What increases the risk? This condition is more likely to occur in people who:  Have a weak defense system (immune system).  Have open wounds on the skin such as cuts, burns, bites, and scrapes. Bacteria can enter the body through these open wounds.  Are older.  Have diabetes.  Have a type of long-lasting (chronic) liver disease (cirrhosis) or kidney disease.  Use IV drugs.  What are the signs or symptoms? Symptoms of this condition include:  Redness, streaking, or spotting on the skin.  Swollen area of the skin.  Tenderness or pain when an area of the skin is touched.  Warm skin.  Fever.  Chills.  Blisters.  How is this diagnosed? This  condition is diagnosed based on a medical history and physical exam. You may also have tests, including:  Blood tests.  Lab tests.  Imaging tests.  How is this treated? Treatment for this condition may include:  Medicines, such as antibiotic medicines or antihistamines.  Supportive care, such as rest and application of cold or warm cloths (cold or warm compresses) to the skin.  Hospital care, if the condition is severe.  The infection usually gets better within 1-2 days of treatment. Follow these instructions at home:  Take over-the-counter and prescription medicines only as told by your health care provider.  If you were prescribed an  antibiotic medicine, take it as told by your health care provider. Do not stop taking the antibiotic even if you start to feel better.  Drink enough fluid to keep your urine clear or pale yellow.  Do not touch or rub the infected area.  Raise (elevate) the infected area above the level of your heart while you are sitting or lying down.  Apply warm or cold compresses to the affected area as told by your health care provider.  Keep all follow-up visits as told by your health care provider. This is important. These visits let your health care provider make sure a more serious infection is not developing. Contact a health care provider if:  You have a fever.  Your symptoms do not improve within 1-2 days of starting treatment.  Your bone or joint underneath the infected area becomes painful after the skin has healed.  Your infection returns in the same area or another area.  You notice a swollen bump in the infected area.  You develop new symptoms.  You have a general ill feeling (malaise) with muscle aches and pains. Get help right away if:  Your symptoms get worse.  You feel very sleepy.  You develop vomiting or diarrhea that persists.  You notice red streaks coming from the infected area.  Your red area gets larger or turns dark in color. This information is not intended to replace advice given to you by your health care provider. Make sure you discuss any questions you have with your health care provider. Document Released: 01/15/2005 Document Revised: 08/16/2015 Document Reviewed: 02/14/2015 Elsevier Interactive Patient Education  2017 [employer].

## 2016-10-27 NOTE — Progress Notes (Signed)
Subjective:    Patient ID: Matthew Wilkerson, male    DOB: 09-10-1945, 71 y.o.   MRN: [id number] Chief Complaint  Patient presents with  . Cellulitis  . Follow-up    HPI  Matthew Wilkerson is a 71 year old male who has a history of a severe left leg cellulitis in 2014 that was very difficult to treat requiring several months of treatment and ultimately resulting in management by infectious disease. Had biopsy during that time showing an acute spongiotic dermatitis.  Patient presented 3 days ago and was seen by my colleague with whole body hives as well as a right greater than left distal lower extremity cellulitis. He had significant right lower extremity swelling with few vesicle formation and so and in the boot wrap was placed and patient was started on Keflex 500 mg 4 times a day and arranged to follow-up with me today. I was able to see patient in the office at that time for comparison.  Matthew Wilkerson feels like he is getting better. He is delighted that his right leg is much less swollen. He recalls that several days before the rash began he was outside helping with yard work and his daughter's yard, so does not know what he was exposed to but was positioned compressed up into a bush that was sticking him in his right leg where the worst of the cellulitis rash is. States he initially thought he had been bit by a bug as it was very pruritic but it was just the brush. Over the next several days he developed full body hives and confluence hot redness of bilateral lower extremities and so presented for further evaluation.  He has use the hydroxyzine a few times which has helped significantly with the itching.  Past Medical History:  Diagnosis Date  . Abnormal PSA 09/2011   7.63,   . Allergy   . BPH (benign prostatic hyperplasia)   . Elevated PSA   . Hernia   . Hyperlipidemia   . Hypertension   . Inguinal hernia    left   . 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 Prostate cancer [facility] dx 11/2010   prostate ca,PSA=7.2 T1c volume=30cc  . S/P radiation therapy within four to twelve weeks 08/23/12-10/18/12   prostate,7800cGy/40 sessions  . Urinary obstruction    Past Surgical History:  Procedure Laterality Date  . HERNIA REPAIR     right inguinal done in early 70's  . PROSTATE BIOPSY  01/08/12   Adenocarcinoma,gleason=3+3=6,& 3+4=7,volume=43cc    Current Outpatient Prescriptions on File Prior to Visit  Medication Sig Dispense Refill  . amLODipine (NORVASC) 10 MG tablet TAKE 1 TABLET (10 MG TOTAL) BY MOUTH DAILY. 90 tablet 3  . cephALEXin (KEFLEX) 500 MG capsule Take 1 capsule (500 mg total) by mouth 4 (four) times daily. 40 capsule 0  . hydrOXYzine (ATARAX/VISTARIL) 25 MG tablet Take 0.5-1 tablets (12.5-25 mg total) by mouth every 8 (eight) hours as needed for itching. 30 tablet 0  . metoprolol succinate (TOPROL-XL) 100 MG 24 hr tablet TAKE 1 TABLET (100 MG TOTAL) BY MOUTH DAILY. TAKE WITH OR IMMEDIATELY FOLLOWING A MEAL. 90 tablet 3   No current facility-administered medications on file prior to visit.    Allergies  Allergen Reactions  . Aspirin Nausea Only    High dose asp.... He tolerates 81 mg   . Codeine Other (See Comments)    Derivatives, "causes my sinuses to hurt"  Family History  Problem Relation Age of Onset  . Cancer Sister 38       pancreatic   Social History   Social History  . Marital status: Divorced    Spouse name: N/A  . Number of children: 2  . Years of education: N/A   Occupational History  . 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 Smoking status: Never Smoker  . Smokeless tobacco: Never Used  . Alcohol use No  . Drug use: Yes    Types: MDMA (Ecstacy)  . Sexual activity: Yes   Other Topics Concern  . None   Social History Narrative  . None   Depression screen [facility] 2/9 10/27/2016 10/24/2016 09/20/2016 06/13/2016  06/07/2016  Decreased Interest 0 0 0 0 0  Down, Depressed, Hopeless 0 0 0 0 0  PHQ - 2 Score 0 0 0 0 0  Altered sleeping - - 0 - -  Tired, decreased energy - - 0 - -  Change in appetite - - 1 - -  Feeling bad or failure about yourself  - - 0 - -  Trouble concentrating - - 0 - -  Moving slowly or fidgety/restless - - 0 - -  Suicidal thoughts - - 0 - -  PHQ-9 Score - - 1 - -    Review of Systems  Constitutional: Negative for activity change, appetite change, chills and fever.  Cardiovascular: Negative for leg swelling.  Gastrointestinal: Negative for abdominal pain, constipation, diarrhea, nausea and vomiting.  Musculoskeletal: Negative for gait problem, joint swelling, myalgias, neck pain and neck stiffness.  Skin: Positive for rash. Negative for wound.  Neurological: Negative for weakness and numbness.  Hematological: Negative for adenopathy. Does not bruise/bleed easily.  Psychiatric/Behavioral: Positive for sleep disturbance.       Objective:   Physical Exam  Constitutional: He is oriented to person, place, and time. He appears well-developed and well-nourished. No distress.  HENT:  Head: Normocephalic and atraumatic.  Eyes: No scleral icterus.  Pulmonary/Chest: Effort normal.  Neurological: He is alert and oriented to person, place, and time.  Skin: Skin is warm and dry. Rash noted. Rash is maculopapular, vesicular and urticarial. He is not diaphoretic. There is erythema.  Psychiatric: He has a normal mood and affect. His behavior is normal.                   BP 137/87   Pulse 76   Temp 97.9 F (36.6 C) (Oral)   Resp 18   Ht _0  (1.702 m)   Wt 207 lb 6.4 oz (94.1 kg)   SpO2 98%   BMI 32.48 kg/m    Results for orders placed or performed in visit on 10/24/16  CBC with Differential/Platelet  Result Value Ref Range   WBC 7.0 3.4 - 10.8 x10E3/uL   RBC 4.50 4.14 - 5.80 x10E6/uL   Hemoglobin 13.6 13.0 - 17.7 g/dL   Hematocrit 41.4 37.5 - 51.0 %   MCV  92 79 - 97 fL   MCH 30.2 26.6 - 33.0 pg   MCHC 32.9 31.5 - 35.7 g/dL   RDW 14.6 12.3 - 15.4 %   Platelets 198 150 - 379 x10E3/uL   Neutrophils 61 Not Estab. %   Lymphs 22 Not Estab. %   Monocytes 9 Not Estab. %   Eos 8 Not Estab. %   Basos 0 Not Estab. %  Neutrophils Absolute 4.3 1.4 - 7.0 x10E3/uL   Lymphocytes Absolute 1.5 0.7 - 3.1 x10E3/uL   Monocytes Absolute 0.6 0.1 - 0.9 x10E3/uL   EOS (ABSOLUTE) 0.6 (H) 0.0 - 0.4 x10E3/uL   Basophils Absolute 0.0 0.0 - 0.2 x10E3/uL   Immature Granulocytes 0 Not Estab. %   Immature Grans (Abs) 0.0 0.0 - 0.1 x10E3/uL  CMP14+EGFR  Result Value Ref Range   Glucose 90 65 - 99 mg/dL   BUN 25 8 - 27 mg/dL   Creatinine, Ser 1.24 0.76 - 1.27 mg/dL   GFR calc non Af Amer 58 (L) >59 mL/min/1.73   GFR calc Af Amer 67 >59 mL/min/1.73   BUN/Creatinine Ratio 20 10 - 24   Sodium 142 134 - 144 mmol/L   Potassium 4.3 3.5 - 5.2 mmol/L   Chloride 105 96 - 106 mmol/L   CO2 21 20 - 29 mmol/L   Calcium 9.9 8.6 - 10.2 mg/dL   Total Protein 7.6 6.0 - 8.5 g/dL   Albumin 4.5 3.5 - 4.8 g/dL   Globulin, Total 3.1 1.5 - 4.5 g/dL   Albumin/Globulin Ratio 1.5 1.2 - 2.2   Bilirubin Total 0.4 0.0 - 1.2 mg/dL   Alkaline Phosphatase 127 (H) 39 - 117 IU/L   AST 30 0 - 40 IU/L   ALT 33 0 - 44 IU/L  Sedimentation rate  Result Value Ref Range   Sed Rate 38 (H) 0 - 30 mm/hr    Assessment & Plan:   1. Allergic urticaria - pt thinks he is improving but urticarial rash looks sig worse to me today and celluitic rash looks little worse. Pt without any systemic sxs of infection but does have sig pruritis with the whole body urticaria.  Does have etiology of getting pricked by bushes outside doing yard work in same area where the worst of the cellulitis is on the right leg 2d prior to this starting so will treat for a rhus dermatitis with oral prednisone as nml wbc but eos are elevated but provide double cellulitis coverage.  If no sig improvement on recheck in 4d (sooner if  worse), will do biopsy - pt agreeable to plan. Reviewed risks of steroid use and poss of infection worsening which would be an emergency.  2. Cellulitis and abscess of leg - right lower ext that was wrapped with unna boot appears slightly improved - no wounds and edema resolved. Cont keflex, add in bactrim.    Meds ordered this encounter  Medications  . predniSONE (DELTASONE) 20 MG tablet    Sig: Take 3 tabs qd x 3d, then 2 tabs qd x 3d then 1 tab qd x 3d.    Dispense:  18 tablet    Refill:  0  . sulfamethoxazole-trimethoprim (BACTRIM DS,SEPTRA DS) 800-160 MG tablet    Sig: Take 1 tablet by mouth 2 (two) times daily.    Dispense:  20 tablet    Refill:  0     Delman Cheadle, M.D.  Primary Care at [facility] [address] [place], [place] [address] [phone] phone [phone] fax  10/29/16 10:33 PM

## 2016-10-30 ENCOUNTER — Encounter: Payer: Self-pay | Admitting: Family Medicine

## 2016-10-30 ENCOUNTER — Ambulatory Visit (INDEPENDENT_AMBULATORY_CARE_PROVIDER_SITE_OTHER): Payer: Medicare Other | Admitting: Family Medicine

## 2016-10-30 VITALS — BP 130/75 | HR 74 | Temp 98.1°F | Resp 18 | Ht 67.0 in | Wt 208.0 lb

## 2016-10-30 DIAGNOSIS — L03115 Cellulitis of right lower limb: Secondary | ICD-10-CM | POA: Diagnosis not present

## 2016-10-30 DIAGNOSIS — L03116 Cellulitis of left lower limb: Secondary | ICD-10-CM | POA: Diagnosis not present

## 2016-10-30 DIAGNOSIS — L500 Allergic urticaria: Secondary | ICD-10-CM | POA: Diagnosis not present

## 2016-10-30 MED ORDER — FUROSEMIDE 20 MG PO TABS: 20.0000 mg | ORAL_TABLET | Freq: Every day | ORAL | 0 refills | Status: DC

## 2016-10-30 NOTE — Progress Notes (Signed)
Subjective:    Patient ID: Matthew Wilkerson, male    DOB: 01-27-1946, 71 y.o.   MRN: [id number] Chief Complaint  Patient presents with  . Allergic Reaction    states the rash on his arm has got better  . Follow-up    HPI  he is tolerating all of the medications well. No side effects. Much less itchy and notes improvement in all rashes.   Past Medical History:  Diagnosis Date  . Abnormal PSA 09/2011   7.63,   . Allergy   . BPH (benign prostatic hyperplasia)   . Elevated PSA   . Hernia   . Hyperlipidemia   . Hypertension   . Inguinal hernia    left   . 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 Prostate cancer [facility]) dx 11/2010   prostate ca,PSA=7.2 T1c volume=30cc  . S/P radiation therapy within four to twelve weeks 08/23/12-10/18/12   prostate,7800cGy/40 sessions  . Urinary obstruction    Past Surgical History:  Procedure Laterality Date  . HERNIA REPAIR     right inguinal done in early 70's  . PROSTATE BIOPSY  01/08/12   Adenocarcinoma,gleason=3+3=6,& 3+4=7,volume=43cc    Current Outpatient Prescriptions on File Prior to Visit  Medication Sig Dispense Refill  . amLODipine (NORVASC) 10 MG tablet TAKE 1 TABLET (10 MG TOTAL) BY MOUTH DAILY. 90 tablet 3  . cephALEXin (KEFLEX) 500 MG capsule Take 1 capsule (500 mg total) by mouth 4 (four) times daily. 40 capsule 0  . hydrOXYzine (ATARAX/VISTARIL) 25 MG tablet Take 0.5-1 tablets (12.5-25 mg total) by mouth every 8 (eight) hours as needed for itching. 30 tablet 0  . metoprolol succinate (TOPROL-XL) 100 MG 24 hr tablet TAKE 1 TABLET (100 MG TOTAL) BY MOUTH DAILY. TAKE WITH OR IMMEDIATELY FOLLOWING A MEAL. 90 tablet 3  . predniSONE (DELTASONE) 20 MG tablet Take 3 tabs qd x 3d, then 2 tabs qd x 3d then 1 tab qd x 3d. 18 tablet 0  . sulfamethoxazole-trimethoprim (BACTRIM DS,SEPTRA DS) 800-160 MG tablet Take 1 tablet by mouth 2 (two) times daily. 20 tablet 0   No current  facility-administered medications on file prior to visit.    Allergies  Allergen Reactions  . Aspirin Nausea Only    High dose asp.... He tolerates 81 mg   . Codeine Other (See Comments)    Derivatives, "causes my sinuses to hurt"   Family History  Problem Relation Age of Onset  . Cancer Sister 37       pancreatic   Social History   Social History  . Marital status: Divorced    Spouse name: N/A  . Number of children: 2  . Years of education: N/A   Occupational History  . 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 Smoking status: Never Smoker  . Smokeless tobacco: Never Used  . Alcohol use No  . Drug use: Yes    Types: MDMA (Ecstacy)  . Sexual activity: Yes   Other Topics Concern  . None   Social History Narrative  . None   Depression screen [facility] 2/9 10/30/2016 10/30/2016 10/27/2016 10/24/2016 09/20/2016  Decreased Interest 0 0 0 0 0  Down, Depressed, Hopeless 0 0 0 0 0  PHQ - 2 Score 0 0 0 0 0  Altered sleeping - - - - 0  Tired, decreased energy - - - -  0  Change in appetite - - - - 1  Feeling bad or failure about yourself  - - - - 0  Trouble concentrating - - - - 0  Moving slowly or fidgety/restless - - - - 0  Suicidal thoughts - - - - 0  PHQ-9 Score - - - - 1      Review of Systems See hpi    Objective:   Physical Exam  Constitutional: He is oriented to person, place, and time. He appears well-developed and well-nourished. No distress.  HENT:  Head: Normocephalic and atraumatic.  Eyes: No scleral icterus.  Cardiovascular:  2+ right lower ext edema, 1+ lt lower ext edema  Pulmonary/Chest: Effort normal.  Neurological: He is alert and oriented to person, place, and time.  Skin: Skin is warm and dry. Rash noted. Rash is maculopapular, vesicular and urticarial. He is not diaphoretic.  Psychiatric: He has a normal mood and affect. His behavior is normal.  Substantial improvement in  all. The urticarial rash over all extremities is much more subtle though still diffuse present. Bilateral lower extremities appear dusky erythema and minimal warmth and tenderness and notable regression    BP 130/75   Pulse 74   Temp 98.1 F (36.7 C) (Oral)   Resp 18   Ht 5\' 7"  (1.702 m)   Wt 208 lb (94.3 kg)   PF 96 L/min   BMI 32.58 kg/m      Assessment & Plan:   1. Cellulitis of leg, left   2. Cellulitis of leg, right   3. Allergic urticaria    substantial improvement in all. Finished courses of Keflex and Bactrim as well as prednisone. RTC if for biopsy. no resolution or worsens at all. Elevate legs and try compression hose  Or Ace wrap to eliminate edema. If several days w/o imprvmnt, start when necessary Lasix with high K diet.  Had patient hold aspirin due to presence on allergy list. However suspect urticaria is due to rhus dermatitis so okay to resume baby aspirin after prednisone course is complete.     Delman Cheadle, M.D.  Primary Care at [facility] [address] [place], [place] [address] [phone] phone [phone] fax  11/02/16 11:06 AM

## 2016-10-30 NOTE — Patient Instructions (Addendum)
Elevated your leg whenever you are sitting. Whenever you are standing, be walking or moving. Apply an ace wrap starting at your foot and going up to below your knee OR a compression sock first thing in the morning and remove before bed. Put your leg on some pillows overnight. Ice above your ankle four times a day for 15 minutes.  If you are still having swelling in your leg by Saturday or Sunday - start the fluid pill that I sent to your pharmacy. If you are taking the fluid pill, make sure to get plenty of high potassium foods in your diet.    Make sure you increase your diet in potassium - high potassium foods are: Bran cereals and other bran products, Milk (skim, 1%), Yogurt, Avocados, Bananas, Dried fruits, Kiwis, Oranges, Prunes, Raisins, Baked beans, Spinach, Tomatoes, Peanut butter, Nuts, and Tofu.  Other foods that have some potassium in them but not quite as much are Cherries, Mangoes, Asparagus, Peas, Zucchini, Celery, Cantaloupes, Fresh peaches, Broccoli stalks, Peppers, Figs, Fresh pears, Kale, and Summer squashes.  Make sure you eat several of these items daily.  FInish your current medication regimen. I think this should do it. Come back if it at all worsens.   IF you received an x-ray today, you will receive an invoice from [facility]. Please contact [facility] at [phone] with questions or concerns regarding your invoice.   IF you received labwork today, you will receive an invoice from [place]. Please contact [facility] at [phone] with questions or concerns regarding your invoice.   Our billing staff will not be able to assist you with questions regarding bills from these companies.  You will be contacted with the lab results as soon as they are available. The fastest way to get your results is to activate your My Chart account. Instructions are located on the last page of this paperwork. If you have not heard from [place] regarding the results in 2 weeks,  please contact this office.      Peripheral Edema Peripheral edema is swelling that is caused by a buildup of fluid. Peripheral edema most often affects the lower legs, ankles, and feet. It can also develop in the arms, hands, and face. The area of the body that has peripheral edema will look swollen. It may also feel heavy or warm. Your clothes may start to feel tight. Pressing on the area may make a temporary dent in your skin. You may not be able to move your arm or leg as much as usual. There are many causes of peripheral edema. It can be a complication of other diseases, such as congestive heart failure, kidney disease, or a problem with your blood circulation. It also can be a side effect of certain medicines. It often happens to women during pregnancy. Sometimes, the cause is not known. Treating the underlying condition is often the only treatment for peripheral edema. Follow these instructions at home: Pay attention to any changes in your symptoms. Take these actions to help with your discomfort:  Raise (elevate) your legs while you are sitting or lying down.  Move around often to prevent stiffness and to lessen swelling. Do not sit or stand for long periods of time.  Wear support stockings as told by your health care provider.  Follow instructions from your health care provider about limiting salt (sodium) in your diet. Sometimes eating less salt can reduce swelling.  Take over-the-counter and prescription medicines only as told by your health care provider.  Your health care provider may prescribe medicine to help your body get rid of excess water (diuretic).  Keep all follow-up visits as told by your health care provider. This is important.  Contact a health care provider if:  You have a fever.  Your edema starts suddenly or is getting worse, especially if you are pregnant or have a medical condition.  You have swelling in only one leg.  You have increased swelling and pain  in your legs. Get help right away if:  You develop shortness of breath, especially when you are lying down.  You have pain in your chest or abdomen.  You feel weak.  You faint. This information is not intended to replace advice given to you by your health care provider. Make sure you discuss any questions you have with your health care provider. Document Released: 05/15/2004 Document Revised: 09/10/2015 Document Reviewed: 10/18/2014 Elsevier Interactive Patient Education  [employer].

## 2016-11-14 ENCOUNTER — Telehealth: Payer: Self-pay | Admitting: Family Medicine

## 2016-11-14 NOTE — Telephone Encounter (Signed)
Dr Brigitte Pulse pt walked in to the clinic stating that you had wanted him to let you know how he is doing he states that the back of his hands is peeling and the back of his knees also hes been using [place] and wanted to know if he should be using anything else please call pt on cell phone

## 2016-11-15 ENCOUNTER — Encounter: Payer: Self-pay | Admitting: Osteopathic Medicine

## 2016-11-15 ENCOUNTER — Ambulatory Visit (INDEPENDENT_AMBULATORY_CARE_PROVIDER_SITE_OTHER): Payer: Medicare Other | Admitting: Osteopathic Medicine

## 2016-11-15 VITALS — BP 135/80 | HR 89 | Temp 97.9°F | Resp 14 | Ht 67.0 in | Wt 206.0 lb

## 2016-11-15 DIAGNOSIS — R234 Changes in skin texture: Secondary | ICD-10-CM

## 2016-11-15 NOTE — Progress Notes (Signed)
HPI: Matthew Wilkerson is a 71 y.o. male  who presents to Primary Care at [facility] today, 11/15/16,  for chief complaint of:  Chief Complaint  Patient presents with  . Rash    Peeling skin on hands and right lower leg    Skin peeling . Context:  o History of severe left leg cellulitis in 2014 requiring treatment management by infectious disease and biopsy showing acute spongiotic dermatitis, recently seen here for cellulitis issues and hives as well. Thinks initially started with possible exposure to plant irritants which caused hives and lower extremity swelling/itching and cellulitis. See below for record review.  o At this point he complains of peeling skin on hands and right lower leg . Location: Dorsum of hands, lateral right lower leg . Quality: Dry skin peeling, no ulceration, no drainage, no redness, no itching, no pain . Severity: Mild . Duration: Past few days . Modifying factors: [place] lotion has been neither helpful not hopeful   Records reviewed:   had been seen here prior to that initially 22 days ago 10/24/2016 for cellulitis of the leg - rash on both legs for about a month, was started on Keflex at that point, treated itching with hydroxyzine. Unna boot was placed on the right lower leg.   Patient came back for recheck 19 days ago 10/27/2016, at that point felt like he was getting better.   Most recent check here was 16 days ago, 10/30/2016, again follow-up visit and improvement was noted. Finished courses of Keflex, Bactrim, prednisone. Was instructed to return for biopsy if no resolution or if there is any worsening of symptoms. Advised elevate legs and try compression hose or Ace wrap, also consider Lasix with high potassium diet.     Past medical, surgical, social and family history reviewed: Patient Active Problem List   Diagnosis Date Noted  . Other and unspecified hyperlipidemia 08/17/2012  . Headache(784.0) 08/17/2012  . Prostate cancer ([place]) 01/08/2012    . HTN (hypertension) 08/01/2011  . Left inguinal hernia 11/20/2010   Past Surgical History:  Procedure Laterality Date  . HERNIA REPAIR     right inguinal done in early 70's  . PROSTATE BIOPSY  01/08/12   Adenocarcinoma,gleason=3+3=6,& 3+4=7,volume=43cc    Social History  Substance Use Topics  . Smoking status: Never Smoker  . Smokeless tobacco: Never Used  . Alcohol use No   Family History  Problem Relation Age of Onset  . Cancer Sister 72       pancreatic     Current medication list and allergy/intolerance information reviewed:   Current Outpatient Prescriptions  Medication Sig Dispense Refill  . amLODipine (NORVASC) 10 MG tablet TAKE 1 TABLET (10 MG TOTAL) BY MOUTH DAILY. 90 tablet 3  . cephALEXin (KEFLEX) 500 MG capsule Take 1 capsule (500 mg total) by mouth 4 (four) times daily. 40 capsule 0  . furosemide (LASIX) 20 MG tablet Take 1 tablet (20 mg total) by mouth daily. As needed for edema. 30 tablet 0  . hydrOXYzine (ATARAX/VISTARIL) 25 MG tablet Take 0.5-1 tablets (12.5-25 mg total) by mouth every 8 (eight) hours as needed for itching. 30 tablet 0  . metoprolol succinate (TOPROL-XL) 100 MG 24 hr tablet TAKE 1 TABLET (100 MG TOTAL) BY MOUTH DAILY. TAKE WITH OR IMMEDIATELY FOLLOWING A MEAL. 90 tablet 3   No current facility-administered medications for this visit.    Allergies  Allergen Reactions  . Aspirin Nausea Only    High dose asp.... He tolerates 81 mg   .  Codeine Other (See Comments)    Derivatives, "causes my sinuses to hurt"      Review of Systems:  Constitutional:  No  fever, no chills, No unintentional weight changes. No significant fatigue.   HEENT: No  headache, no vision change  Cardiac: No  chest pain, No  pressure, No palpitations  Respiratory:  No  shortness of breath. No  Cough  Gastrointestinal: No  abdominal pain, No  nausea,  Musculoskeletal: No new myalgia/arthralgia  Skin: No  Rash, No other wounds/concerning lesions other than  peeling   Neurologic: No  weakness, No  dizziness   Exam:  BP 135/80   Pulse 89   Temp 97.9 F (36.6 C) (Oral)   Resp 14   Ht 5\' 7"  (1.702 m)   Wt 206 lb (93.4 kg)   SpO2 96%   BMI 32.26 kg/m   Constitutional: VS see above. General Appearance: alert, well-developed, well-nourished, NAD  Eyes: Normal lids and conjunctive, non-icteric sclera  Ears, Nose, Mouth, Throat: MMM, Normal external inspection ears/nares/mouth/lips/gums.   Neck: No masses, trachea midline.   Respiratory: Normal respiratory effort. no wheeze, no rhonchi, no rales  Cardiovascular: S1/S2 normal, no murmur, no rub/gallop auscultated. RRR. Trace lower extremity edema.   Musculoskeletal: Gait normal.   Neurological: Normal balance/coordination. No tremor.   Skin: warm, dry. No rash/ulcer. No concerning nevi or subq nodules on limited exam. Dry peeling skin on dorsum of hands and lateral right lower extremity, see photograph as noted below    Psychiatric: Normal judgment/insight. Normal mood and affect. Oriented x3.   Today: 11/15/16           ASSESSMENT/PLAN: The encounter diagnosis was Peeling skin.   Peeling skin at this point seems more consistent with sequela of edema and other skin inflammation rather than new concerning ulcerative or desquamative process. Patient also has recently changed lotions. I think watchful waiting at this point is reasonable. I don't think lotions will be helpful or hurtful, I would advise avoid over exfoliation.    Patient Instructions    Right now, this looks to me like dead skin just peeling off, instead of any dangerous disease like healthy skin coming off, blistering or drainage, or other reaction or infection.   If this develops redness, blisters, ulcers, drainage or if you develop swelling, fever, or other concerns, please let us know.   For now I think you can continue to use mild soap and lotions and this will resolve on its own. If anything changes or  gets worse, please come see [place]!      Visit summary with medication list and pertinent instructions was printed for patient to review. All questions at time of visit were answered - patient instructed to contact office with any additional concerns. ER/RTC precautions were reviewed with the patient. Follow-up plan: Return if symptoms worsen or fail to improve, and as directed by primary doctor for routine care.

## 2016-11-15 NOTE — Patient Instructions (Addendum)
   Right now, this looks to me like dead skin just peeling off, instead of any dangerous disease like healthy skin coming off, blistering or drainage, or other reaction or infection.   If this develops redness, blisters, ulcers, drainage or if you develop swelling, fever, or other concerns, please let us know.   For now I think you can continue to use mild soap and lotions and this will resolve on its own. If anything changes or gets worse, please come see [place]!       IF you received an x-ray today, you will receive an invoice from [facility]. Please contact [facility] at [phone] with questions or concerns regarding your invoice.   IF you received labwork today, you will receive an invoice from [place]. Please contact [facility] at [phone] with questions or concerns regarding your invoice.   Our billing staff will not be able to assist you with questions regarding bills from these companies.  You will be contacted with the lab results as soon as they are available. The fastest way to get your results is to activate your My Chart account. Instructions are located on the last page of this paperwork. If you have not heard from [place] regarding the results in 2 weeks, please contact this office.

## 2016-11-17 ENCOUNTER — Other Ambulatory Visit: Payer: Self-pay | Admitting: Emergency Medicine

## 2016-11-17 NOTE — Telephone Encounter (Signed)
FYI

## 2016-11-18 NOTE — Telephone Encounter (Signed)
Noted, he was seen the day following the initial presentation by Dr. Sheppard Coil who provided reassurance that this was expected sequelae of previous inflammation.

## 2016-11-27 ENCOUNTER — Other Ambulatory Visit: Payer: Self-pay | Admitting: Family Medicine

## 2016-11-27 NOTE — Telephone Encounter (Signed)
No - he did NOT need to stay on this - we were just using it to treat the acute swelling from the allergic reaction/cellulitis temporarily.  Make sure he is sticking to a low salt diet with plenty of water and protein.

## 2016-11-27 NOTE — Telephone Encounter (Signed)
Pt seen on return visit 7/28 with dx: peeling skin. Unsure if Matthew Wilkerson wants pt to remain on the Furosemide. Please advise.

## 2016-11-28 NOTE — Progress Notes (Unsigned)
Canceled letter.  Patient returned call before I finished writing.   Advised we did not want to refill the Furosemide. Wanted to make sure pt is drinking plenty of water and eating good protein.  Pt agrees and understood.    He will return as scheduled and will bring bottle of HTN meds prescribed by Dr. Everlene Farrier that he will need refilled.

## 2016-12-02 ENCOUNTER — Telehealth: Payer: Self-pay

## 2016-12-02 NOTE — Telephone Encounter (Signed)
Called pt to schedule Medicare Annual Wellness Visit with nurse health advisor prior to upcoming visit with pcp which will be a follow up visit for awv. -nr

## 2016-12-14 DIAGNOSIS — E6609 Other obesity due to excess calories: Secondary | ICD-10-CM | POA: Insufficient documentation

## 2016-12-14 DIAGNOSIS — Z6832 Body mass index (BMI) 32.0-32.9, adult: Secondary | ICD-10-CM

## 2016-12-14 NOTE — Progress Notes (Signed)
Subjective:    Patient ID: Matthew Wilkerson, male    DOB: Mar 31, 1946, 71 y.o.   MRN: [id number] Chief Complaint  Patient presents with  . Hypertension  . Edema    HPI  Matthew Wilkerson is a delightful 71 yo here today for a refill of his chronic medications. I first met Matthew Wilkerson 6 weeks ago when he had a contact dermatitis/cellulitis rash on her left leg that we had to monitor over several visits. He was initially scheduled for his CPE/AWV today but informed me that he did NOT want to do that when I came into the room - wanted to get his BP meds refilled and his persistent lower ext edema evaluated.  Primary Preventative Screenings: Prostate Cancer:  [facility] 10/10/14 STI screening: hep C neg 2017 Colorectal Cancer:  Declined colonoscopy, cologuard, and [place] cards in prior years. Tobacco use/AAA/Lung/EtOH/Illicit substances:  Cardiac: no prior EKG in Epic Weight/Blood sugar/Diet/Exercise: no prior elev blood sugars. Obese but weight stable over past sev years OTC/Vit/Supp/Herbal: Dentist/Optho: Immunizations: has declined all vaccines prior inc pneumonia and shingles vaccine  Immunization History  Administered Date(s) Administered  . Tdap 04/21/2009     Chronic Medical Conditions: HTN: On norvasc 10 and toprol 100 qd, asa 81 qd.  I started pt on lasix 20 prn edema 6 wks ago when he had a lower ext contact dermatitis with secondary cellulitis. eGFR 67 Lab Results  Component Value Date   CREATININE 1.24 10/24/2016   CREATININE 1.36 (H) 11/09/2015   CREATININE 1.29 (H) 01/18/2015    HLD: On lipitor 20 which I had pt hold 6 wks ago when he presented with an acute urticarial rash of unknown etiology - was just trying to minimize meds but no suspicion that he was having a drug rash.  Lipid panel last checked 2 yrs prior 12/2014 after which he was started on atorvastatin as LDL 135, non-HDL 164  H/o prostate cancer: Dx'd 2012 by Dr. [name] at [facility]. S/p  radiation therapy in 2014.  - last OV w/ urology 09/2014. PSA drawn at that time - result unknown but prior 3 had been around 0.7 with most recent 03/2014. Pt was advised to cont to f/u with urology for PSA and DRE every  6 mos but has been 2 years since his last visit. Also has ED - was advised he could try genetic viagra Had some BPH with urinary obstruction sxs - slowing of the stream, nocturia of 2x/night - tried on flomax 2 yrs ago  +inguinal hernia noted on exam last year by prior PCP Dr. Everlene Farrier which is not causing any pain or discomfort but is slowly increasing in size. Had hernia repair on the opposite side about 40 years ago. He did see a Psychologist, sport and exercise at [facility] about having the hernia repaired but didn't remember what came from it.  Isn't limiting him.  Past Medical History:  Diagnosis Date  . Abnormal PSA 09/2011   7.63,   . Allergy   . BPH (benign prostatic hyperplasia)   . Elevated PSA   . Hernia   . Hyperlipidemia   . Hypertension   . Inguinal hernia    left   . Prostate CA ([place]) 01/08/12   bx=Adenocarcinoma, gleason 6 (3+3), prostate volume 43 cc  . Prostate cancer [facility]) dx 11/2010   prostate ca,PSA=7.2 T1c volume=30cc  . S/P radiation therapy within four to twelve weeks 08/23/12-10/18/12   prostate,7800cGy/40 sessions  . Urinary obstruction  Past Surgical History:  Procedure Laterality Date  . HERNIA REPAIR     right inguinal done in early 70's  . PROSTATE BIOPSY  01/08/12   Adenocarcinoma,gleason=3+3=6,& 3+4=7,volume=43cc    No current outpatient prescriptions on file prior to visit.   No current facility-administered medications on file prior to visit.    Allergies  Allergen Reactions  . Aspirin Nausea Only    High dose asp.... He tolerates 81 mg   . Codeine Other (See Comments)    Derivatives, "causes my sinuses to hurt"   Family History  Problem Relation Age of Onset  . Cancer Sister 38       pancreatic   Social History   Social History  .  Marital status: Divorced    Spouse name: N/A  . Number of children: 2  . Years of education: N/A   Occupational History  . 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 Smoking status: Never Smoker  . Smokeless tobacco: Never Used  . Alcohol use No  . Drug use: Yes    Types: MDMA (Ecstacy)  . Sexual activity: Yes   Other Topics Concern  . None   Social History Narrative  . None   Depression screen [facility] 2/9 12/15/2016 11/15/2016 10/30/2016 10/30/2016 10/27/2016  Decreased Interest 0 0 0 0 0  Down, Depressed, Hopeless 0 0 0 0 0  PHQ - 2 Score 0 0 0 0 0  Altered sleeping - - - - -  Tired, decreased energy - - - - -  Change in appetite - - - - -  Feeling bad or failure about yourself  - - - - -  Trouble concentrating - - - - -  Moving slowly or fidgety/restless - - - - -  Suicidal thoughts - - - - -  PHQ-9 Score - - - - -     Review of Systems  All other systems reviewed and are negative. other than noted in HPI     Objective:   Physical Exam  Constitutional: He is oriented to person, place, and time. He appears well-developed and well-nourished. No distress.  HENT:  Head: Normocephalic and atraumatic.  Eyes: Pupils are equal, round, and reactive to light. Conjunctivae are normal. No scleral icterus.  Neck: Normal range of motion. Neck supple. No thyromegaly present.  Cardiovascular: Normal rate, regular rhythm, normal heart sounds and intact distal pulses.   Pulmonary/Chest: Effort normal and breath sounds normal. No respiratory distress.  Musculoskeletal: He exhibits no edema.  Lymphadenopathy:    He has no cervical adenopathy.  Neurological: He is alert and oriented to person, place, and time.  Skin: Skin is warm and dry. He is not diaphoretic.  2+ pitting edema in Rt, 1+ in left. Very mild erythema and warmth around distal right - chronic per pt. 2+ pedal pulses.  Psychiatric: He has a normal  mood and affect. His behavior is normal.     BP 129/79   Pulse 70   Temp 98.1 F (36.7 C) (Oral)   Resp 18   Ht 5' 7"  (1.702 m)   Wt 206 lb (93.4 kg)   SpO2 97%   BMI 32.26 kg/m   Assessment & Plan:  Needs Flu shot, prevnar-13. Shingrix. - pt refuses all vaccinations.  nml cbc, cmp last month. nml tsh last year.  Sched AWV with [facility].  1. Essential hypertension - cont toprol  2. Prostate  cancer [facility]) - pt was supposed to f/u with urology every 6 mos - has been 2 yrs, agrees to reestablish and requests to defer PSA to that visit.  3. Pure hypercholesterolemia - Restart lipitor 20.  Needs flp after he restarts on atorvastatin for 3-4 mos - will also repeat cmp at that time.   4. Class 1 obesity due to excess calories with serious comorbidity and body mass index (BMI) of 32.0 to 32.9 in adult   5. Unilateral inguinal hernia without obstruction or gangrene, recurrence not specified   6. Edema of right lower extremity - chronic since contact dermatitis followed by secodary cellulitis 6 wks prior but did not resolve despite other sxs resolving. Could be due to the amlodipine so will do 1 mo trial off - start lisinopril to cover for BP instead of amlodipine. Cont lasix 20. Recheck in 1 mo - if not resolved, cons biopsy. If worsens at all, needs doppler. Had similar episode in Rt leg sev yrs ago - eventually resolved with recurrent abx treatment - was a chronic cellulitis and is having slightly more warmth and erythema but subtle.    Inguinal Hernia - asymptomatic. Reviewed +/- of elective repair. Will get urology opinion.  Orders Placed This Encounter  Procedures  . Ambulatory referral to Urology    Referral Priority:   Routine    Referral Type:   Consultation    Referral Reason:   Specialty Services Required    Requested Specialty:   Urology    Number of Visits Requested:   1  . POCT urinalysis dipstick    Meds ordered this encounter  Medications  . lisinopril  (PRINIVIL,ZESTRIL) 40 MG tablet    Sig: Take 1 tablet (40 mg total) by mouth daily.    Dispense:  90 tablet    Refill:  0  . metoprolol succinate (TOPROL-XL) 100 MG 24 hr tablet    Sig: TAKE 1 TABLET (100 MG TOTAL) BY MOUTH DAILY. TAKE WITH OR IMMEDIATELY FOLLOWING A MEAL.    Dispense:  90 tablet    Refill:  3  . furosemide (LASIX) 20 MG tablet    Sig: Take 1 tablet (20 mg total) by mouth daily. As needed for edema.    Dispense:  30 tablet    Refill:  0  . atorvastatin (LIPITOR) 20 MG tablet    Sig: Take 1 tablet (20 mg total) by mouth daily.    Dispense:  90 tablet    Refill:  3    Delman Cheadle, M.D.  Primary Care at [facility] [address] [place], [place] [address] [phone] phone [phone] fax  12/16/16 10:23 PM

## 2016-12-15 ENCOUNTER — Ambulatory Visit (INDEPENDENT_AMBULATORY_CARE_PROVIDER_SITE_OTHER): Payer: Medicare Other | Admitting: Family Medicine

## 2016-12-15 ENCOUNTER — Encounter: Payer: Self-pay | Admitting: Family Medicine

## 2016-12-15 VITALS — BP 129/79 | HR 70 | Temp 98.1°F | Resp 18 | Ht 67.0 in | Wt 206.0 lb

## 2016-12-15 DIAGNOSIS — R600 Localized edema: Secondary | ICD-10-CM

## 2016-12-15 DIAGNOSIS — K4090 Unilateral inguinal hernia, without obstruction or gangrene, not specified as recurrent: Secondary | ICD-10-CM

## 2016-12-15 DIAGNOSIS — E6609 Other obesity due to excess calories: Secondary | ICD-10-CM

## 2016-12-15 DIAGNOSIS — E7800 Pure hypercholesterolemia, unspecified: Secondary | ICD-10-CM | POA: Diagnosis not present

## 2016-12-15 DIAGNOSIS — Z6832 Body mass index (BMI) 32.0-32.9, adult: Secondary | ICD-10-CM

## 2016-12-15 DIAGNOSIS — C61 Malignant neoplasm of prostate: Secondary | ICD-10-CM

## 2016-12-15 DIAGNOSIS — I10 Essential (primary) hypertension: Secondary | ICD-10-CM

## 2016-12-15 LAB — POCT URINALYSIS DIP (MANUAL ENTRY)
Bilirubin, UA: NEGATIVE
GLUCOSE UA: NEGATIVE mg/dL
Ketones, POC UA: NEGATIVE mg/dL
Leukocytes, UA: NEGATIVE
NITRITE UA: NEGATIVE
PH UA: 5.5 (ref 5.0–8.0)
PROTEIN UA: NEGATIVE mg/dL
RBC UA: NEGATIVE
SPEC GRAV UA: 1.015 (ref 1.010–1.025)
UROBILINOGEN UA: 0.2 U/dL

## 2016-12-15 MED ORDER — FUROSEMIDE 20 MG PO TABS: 20.0000 mg | ORAL_TABLET | Freq: Every day | ORAL | 0 refills | Status: DC

## 2016-12-15 MED ORDER — LISINOPRIL 40 MG PO TABS: 40.0000 mg | ORAL_TABLET | Freq: Every day | ORAL | 0 refills | Status: DC

## 2016-12-15 MED ORDER — ATORVASTATIN CALCIUM 20 MG PO TABS: 20.0000 mg | ORAL_TABLET | Freq: Every day | ORAL | 3 refills | Status: DC

## 2016-12-15 MED ORDER — METOPROLOL SUCCINATE ER 100 MG PO TB24: ORAL_TABLET | ORAL | 3 refills | Status: DC

## 2016-12-15 NOTE — Patient Instructions (Addendum)
     IF you received an x-ray today, you will receive an invoice from [facility]. Please contact [facility]iology at [phone] with questions or concerns regarding your invoice.   IF you received labwork today, you will receive an invoice from [place]. Please contact [facility] at [phone] with questions or concerns regarding your invoice.   Our billing staff will not be able to assist you with questions regarding bills from these companies.  You will be contacted with the lab results as soon as they are available. The fastest way to get your results is to activate your My Chart account. Instructions are located on the last page of this paperwork. If you have not heard from [place] regarding the results in 2 weeks, please contact this office.      Peripheral Edema Peripheral edema is swelling that is caused by a buildup of fluid. Peripheral edema most often affects the lower legs, ankles, and feet. It can also develop in the arms, hands, and face. The area of the body that has peripheral edema will look swollen. It may also feel heavy or warm. Your clothes may start to feel tight. Pressing on the area may make a temporary dent in your skin. You may not be able to move your arm or leg as much as usual. There are many causes of peripheral edema. It can be a complication of other diseases, such as congestive heart failure, kidney disease, or a problem with your blood circulation. It also can be a side effect of certain medicines. It often happens to women during pregnancy. Sometimes, the cause is not known. Treating the underlying condition is often the only treatment for peripheral edema. Follow these instructions at home: Pay attention to any changes in your symptoms. Take these actions to help with your discomfort:  Raise (elevate) your legs while you are sitting or lying down.  Move around often to prevent stiffness and to lessen swelling. Do not sit or stand for long periods of  time.  Wear support stockings as told by your health care provider.  Follow instructions from your health care provider about limiting salt (sodium) in your diet. Sometimes eating less salt can reduce swelling.  Take over-the-counter and prescription medicines only as told by your health care provider. Your health care provider may prescribe medicine to help your body get rid of excess water (diuretic).  Keep all follow-up visits as told by your health care provider. This is important.  Contact a health care provider if:  You have a fever.  Your edema starts suddenly or is getting worse, especially if you are pregnant or have a medical condition.  You have swelling in only one leg.  You have increased swelling and pain in your legs. Get help right away if:  You develop shortness of breath, especially when you are lying down.  You have pain in your chest or abdomen.  You feel weak.  You faint. This information is not intended to replace advice given to you by your health care provider. Make sure you discuss any questions you have with your health care provider. Document Released: 05/15/2004 Document Revised: 09/10/2015 Document Reviewed: 10/18/2014 Elsevier Interactive Patient Education  [employer].

## 2016-12-17 ENCOUNTER — Other Ambulatory Visit: Payer: Self-pay | Admitting: Emergency Medicine

## 2016-12-18 ENCOUNTER — Other Ambulatory Visit: Payer: Self-pay | Admitting: Emergency Medicine

## 2016-12-22 ENCOUNTER — Telehealth: Payer: Self-pay | Admitting: Family Medicine

## 2016-12-22 NOTE — Telephone Encounter (Signed)
Answering service call,  Complaints of headache. States he was seen a few days ago, had medications changed. Wondering if headache was due to new medicine.. Was continued on metoprolol atorvastatin and started lisinopril.  Had a headache past few days, but is improving today. Denies this being his worst headache of his life. Denies slurred speech, focal weakness or facial droop. He has been prescribed meloxicam in the past for pain by Dr. Everlene Farrier, but was not sure if he should take it. He has not checked his blood pressure at home.  Advised to monitor blood pressure readings as that may contribute to headache if elevated. Would recommend against meloxicam for now until readings are known. Recommended follow-up in office in the next day or 2 if headaches are not continuing to improve, ER precautions given and if any acute worsening or other neurologic symptoms. Understanding expressed.

## 2016-12-29 ENCOUNTER — Ambulatory Visit (INDEPENDENT_AMBULATORY_CARE_PROVIDER_SITE_OTHER): Payer: Medicare Other | Admitting: Family Medicine

## 2016-12-29 ENCOUNTER — Encounter: Payer: Self-pay | Admitting: Family Medicine

## 2016-12-29 VITALS — BP 142/80 | HR 59 | Temp 97.7°F | Resp 16 | Ht 68.11 in | Wt 206.0 lb

## 2016-12-29 DIAGNOSIS — J309 Allergic rhinitis, unspecified: Secondary | ICD-10-CM

## 2016-12-29 DIAGNOSIS — R519 Headache, unspecified: Secondary | ICD-10-CM

## 2016-12-29 DIAGNOSIS — R51 Headache: Secondary | ICD-10-CM | POA: Diagnosis not present

## 2016-12-29 DIAGNOSIS — I10 Essential (primary) hypertension: Secondary | ICD-10-CM | POA: Diagnosis not present

## 2016-12-29 DIAGNOSIS — R609 Edema, unspecified: Secondary | ICD-10-CM

## 2016-12-29 LAB — POCT SEDIMENTATION RATE: POCT SED RATE: 43 mm/h — AB (ref 0–22)

## 2016-12-29 MED ORDER — AMLODIPINE BESYLATE 2.5 MG PO TABS: 2.5000 mg | ORAL_TABLET | Freq: Every day | ORAL | 2 refills | Status: DC

## 2016-12-29 NOTE — Progress Notes (Signed)
Subjective:    Patient ID: Matthew Wilkerson, male    DOB: March 07, 1946, 71 y.o.   MRN: [id number]  HPI Matthew Wilkerson is a 71 y.o. male Presents today for: Chief Complaint  Patient presents with  . Headache    pt states he belives he is having these headahes since his medication change at his last OV 12/15/16. He was given Lisinopril    History of hyperlipidemia, hypertension, obesity, prostate cancer. He was last seen by Dr. Brigitte Pulse on August 27 evaluated for hypertension and lower extremity edema, along with review of other chronic medical issues. History of hyperlipidemia, was restarted on Lipitor 20 mg daily.  Amlodipine was held due to his lower extremity edema, and changed to lisinopril, continue Lasix 20 mg daily. Continued on Toprol 100 mg daily.  Blood pressure was 129/79 at that visit.  I spoke to him on the phone at an answering service call on September 3. Complained of a headache at that time, not being the worse headache of his life, no other neurologic symptoms. Headache was improving at that call. Recommend he follow-up in a few days if the headache was not continuing to improve, and to monitor home blood pressure readings as that may be contributing.  Headache has been present off and on for past 8 days. Notes behind R eye. Notices some postnasal drip. No fever. Slight pressure in R frontal sinus area. No known recent sinus infection or chronic issues with sinuses. No blurry or darkening of vision. No N/V. No weakness, no facial droop or slurred speech.  Not worst HA of life. Has not been checking BP readings.   Tx: 2 tylenol 2 days ago - did ease off discomfort some.   Patient Active Problem List   Diagnosis Date Noted  . Class 1 obesity due to excess calories with serious comorbidity and body mass index (BMI) of 32.0 to 32.9 in adult 12/14/2016  . Hyperlipidemia 08/17/2012  . Headache(784.0) 08/17/2012  . Prostate cancer ([place]) 01/08/2012  . HTN (hypertension) 08/01/2011    . Left inguinal hernia 11/20/2010   Past Medical History:  Diagnosis Date  . Abnormal PSA 09/2011   7.63,   . Allergy   . BPH (benign prostatic hyperplasia)   . Elevated PSA   . Hernia   . Hyperlipidemia   . Hypertension   . Inguinal hernia    left   . Prostate CA ([place]) 01/08/12   bx=Adenocarcinoma, gleason 6 (3+3), prostate volume 43 cc  . Prostate cancer [facility]) dx 11/2010   prostate ca,PSA=7.2 T1c volume=30cc  . S/P radiation therapy within four to twelve weeks 08/23/12-10/18/12   prostate,7800cGy/40 sessions  . Urinary obstruction    Past Surgical History:  Procedure Laterality Date  . HERNIA REPAIR     right inguinal done in early 70's  . PROSTATE BIOPSY  01/08/12   Adenocarcinoma,gleason=3+3=6,& 3+4=7,volume=43cc    Allergies  Allergen Reactions  . Aspirin Nausea Only    High dose asp.... He tolerates 81 mg   . Codeine Other (See Comments)    Derivatives, "causes my sinuses to hurt"   Prior to Admission medications   Medication Sig Start Date End Date Taking? Authorizing Provider  atorvastatin (LIPITOR) 20 MG tablet Take 1 tablet (20 mg total) by mouth daily. 12/15/16  Yes Shawnee Knapp, MD  furosemide (LASIX) 20 MG tablet Take 1 tablet (20 mg total) by mouth daily. As needed for edema. 12/15/16  Yes Shawnee Knapp, MD  lisinopril (PRINIVIL,ZESTRIL)  40 MG tablet Take 1 tablet (40 mg total) by mouth daily. 12/15/16  Yes Shawnee Knapp, MD  metoprolol succinate (TOPROL-XL) 100 MG 24 hr tablet TAKE 1 TABLET (100 MG TOTAL) BY MOUTH DAILY. TAKE WITH OR IMMEDIATELY FOLLOWING A MEAL. 12/15/16  Yes Shawnee Knapp, MD   Social History   Social History  . Marital status: Divorced    Spouse name: N/A  . Number of children: 2  . Years of education: N/A   Occupational History  . 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 Smoking status: Never Smoker  . Smokeless tobacco: Never Used  . Alcohol use No  . Drug use:  Yes    Types: MDMA (Ecstacy)  . Sexual activity: Yes   Other Topics Concern  . Not on file   Social History Narrative  . No narrative on file    Review of Systems  Constitutional: Negative for chills, fatigue, fever and unexpected weight change.  HENT: Positive for postnasal drip.   Eyes: Negative for visual disturbance.  Respiratory: Negative for cough, chest tightness and shortness of breath.   Cardiovascular: Negative for chest pain, palpitations and leg swelling.  Gastrointestinal: Negative for abdominal pain and blood in stool.  Neurological: Positive for headaches. Negative for dizziness, speech difficulty, weakness and light-headedness.       Objective:   Physical Exam  Constitutional: He is oriented to person, place, and time. He appears well-developed and well-nourished.  HENT:  Head: Normocephalic and atraumatic.  Right Ear: Tympanic membrane, external ear and ear canal normal.  Left Ear: Tympanic membrane, external ear and ear canal normal.  Nose: No rhinorrhea.  Mouth/Throat: Oropharynx is clear and moist and mucous membranes are normal. No oropharyngeal exudate or posterior oropharyngeal erythema.  Eyes: Pupils are equal, round, and reactive to light. Conjunctivae and EOM are normal.  Neck: Neck supple. No JVD present. Carotid bruit is not present.  Cardiovascular: Normal rate, regular rhythm, normal heart sounds and intact distal pulses.   No murmur heard. Pulmonary/Chest: Effort normal and breath sounds normal. He has no wheezes. He has no rhonchi. He has no rales.  Abdominal: Soft. There is no tenderness.  Musculoskeletal: He exhibits no edema.  Lymphadenopathy:    He has no cervical adenopathy.  Trace pedal edema at ankles  Neurological: He is alert and oriented to person, place, and time. He has normal strength. He displays no tremor. No cranial nerve deficit or sensory deficit. He displays a negative Romberg sign. Coordination and gait normal. GCS eye  subscore is 4. GCS verbal subscore is 5. GCS motor subscore is 6.  No cords or temple tenderness., Nonfocal exam, no pronator drift.  Skin: Skin is warm and dry. No rash noted.  Psychiatric: He has a normal mood and affect. His behavior is normal.  Vitals reviewed.  Vitals:   12/29/16 1048 12/29/16 1049 12/29/16 1140  BP: (!) 155/76 (!) 148/88 (!) 142/80  Pulse: (!) 59    Resp: 16    Temp: 97.7 F (36.5 C)    TempSrc: Oral    SpO2: 96%    Weight: 206 lb (93.4 kg)    Height: 5' 8.11" (1.73 m)        Assessment & Plan:    Matthew Wilkerson is a 71 y.o. male Right-sided headache - Plan: POCT SEDIMENTATION RATE  Essential hypertension - Plan: amLODipine (NORVASC) 2.5 MG tablet, Basic metabolic  panel  Peripheral edema - Plan: Basic metabolic panel  Allergic rhinitis, unspecified seasonality, unspecified trigger  Headache may be multifactorial, with allergies, hypertension that is not controlled, less likely ophthalmic.  -Add Norvasc 2.5 mg daily. To see discontinued due to peripheral edema, but was on higher dose. RTC precautions if worsening pedal edema. Continue same dose of lisinopril, Toprol. Check BMP as recent ACE inhibitor  - Over-the-counter Flonase daily  - If headaches persist, would recommend ophthalmology eval. ER/RTC precautions if acute worsening  Meds ordered this encounter  Medications  . amLODipine (NORVASC) 2.5 MG tablet    Sig: Take 1 tablet (2.5 mg total) by mouth daily.    Dispense:  30 tablet    Refill:  2   Patient Instructions    Headache may be due to multiple causes. With drainage, you may have some allergies, so try Flonase nasal spray over-the-counter 1 spray per nostril each day. Additionally blood pressure was slightly elevated today, so restart amlodipine but at a lower dose of 2.5 mg each day. Continue your other medications including the lisinopril that was started last visit. I will check an inflammation test, but unlikely concerning  headache.  With the new blood pressure medicine I am also checking your kidney function again.  If your headache is not improving this week with use of allergy medicines and addition of new blood pressure medicine, I would like you to be seen by an ophthalmologist. Please call us if you have not improved and I will schedule that appointment this week. Return to the clinic or go to the nearest emergency room if any of your symptoms worsen or new symptoms occur.  If swelling in ankles worsens, return to discuss other medication changes  General Headache Without Cause A headache is pain or discomfort felt around the head or neck area. The specific cause of a headache may not be found. There are many causes and types of headaches. A few common ones are:  Tension headaches.  Migraine headaches.  Cluster headaches.  Chronic daily headaches.  Follow these instructions at home: Watch your condition for any changes. Take these steps to help with your condition: Managing pain  Take over-the-counter and prescription medicines only as told by your health care provider.  Lie down in a dark, quiet room when you have a headache.  If directed, apply ice to the head and neck area: ? Put ice in a plastic bag. ? Place a towel between your skin and the bag. ? Leave the ice on for 20 minutes, 2-3 times per day.  Use a heating pad or hot shower to apply heat to the head and neck area as told by your health care provider.  Keep lights dim if bright lights bother you or make your headaches worse. Eating and drinking  Eat meals on a regular schedule.  Limit alcohol use.  Decrease the amount of caffeine you drink, or stop drinking caffeine. General instructions  Keep all follow-up visits as told by your health care provider. This is important.  Keep a headache journal to help find out what may trigger your headaches. For example, write down: ? What you eat and drink. ? How much sleep you  get. ? Any change to your diet or medicines.  Try massage or other relaxation techniques.  Limit stress.  Sit up straight, and do not tense your muscles.  Do not use tobacco products, including cigarettes, chewing tobacco, or e-cigarettes. If you need help quitting, ask your health  care provider.  Exercise regularly as told by your health care provider.  Sleep on a regular schedule. Get 7-9 hours of sleep, or the amount recommended by your health care provider. Contact a health care provider if:  Your symptoms are not helped by medicine.  You have a headache that is different from the usual headache.  You have nausea or you vomit.  You have a fever. Get help right away if:  Your headache becomes severe.  You have repeated vomiting.  You have a stiff neck.  You have a loss of vision.  You have problems with speech.  You have pain in the eye or ear.  You have muscular weakness or loss of muscle control.  You lose your balance or have trouble walking.  You feel faint or pass out.  You have confusion. This information is not intended to replace advice given to you by your health care provider. Make sure you discuss any questions you have with your health care provider. Document Released: 04/07/2005 Document Revised: 09/13/2015 Document Reviewed: 07/31/2014 Elsevier Interactive Patient Education  2017 [employer].    IF you received an x-ray today, you will receive an invoice from [facility]. Please contact [facility] at [phone] with questions or concerns regarding your invoice.   IF you received labwork today, you will receive an invoice from [place]. Please contact [facility] at [phone] with questions or concerns regarding your invoice.   Our billing staff will not be able to assist you with questions regarding bills from these companies.  You will be contacted with the lab results as soon as they are available. The fastest way to  get your results is to activate your My Chart account. Instructions are located on the last page of this paperwork. If you have not heard from [place] regarding the results in 2 weeks, please contact this office.       I personally performed the services described in this documentation, which was scribed in my presence. The recorded information has been reviewed and considered for accuracy and completeness, addended by me as needed, and agree with information above.  Signed,   [name], MD [facility].  12/31/16 4:39 PM

## 2016-12-29 NOTE — Patient Instructions (Addendum)
Headache may be due to multiple causes. With drainage, you may have some allergies, so try Flonase nasal spray over-the-counter 1 spray per nostril each day. Additionally blood pressure was slightly elevated today, so restart amlodipine but at a lower dose of 2.5 mg each day. Continue your other medications including the lisinopril that was started last visit. I will check an inflammation test, but unlikely concerning headache.  With the new blood pressure medicine I am also checking your kidney function again.  If your headache is not improving this week with use of allergy medicines and addition of new blood pressure medicine, I would like you to be seen by an ophthalmologist. Please call us if you have not improved and I will schedule that appointment this week. Return to the clinic or go to the nearest emergency room if any of your symptoms worsen or new symptoms occur.  If swelling in ankles worsens, return to discuss other medication changes  General Headache Without Cause A headache is pain or discomfort felt around the head or neck area. The specific cause of a headache may not be found. There are many causes and types of headaches. A few common ones are:  Tension headaches.  Migraine headaches.  Cluster headaches.  Chronic daily headaches.  Follow these instructions at home: Watch your condition for any changes. Take these steps to help with your condition: Managing pain  Take over-the-counter and prescription medicines only as told by your health care provider.  Lie down in a dark, quiet room when you have a headache.  If directed, apply ice to the head and neck area: ? Put ice in a plastic bag. ? Place a towel between your skin and the bag. ? Leave the ice on for 20 minutes, 2-3 times per day.  Use a heating pad or hot shower to apply heat to the head and neck area as told by your health care provider.  Keep lights dim if bright lights bother you or make your headaches  worse. Eating and drinking  Eat meals on a regular schedule.  Limit alcohol use.  Decrease the amount of caffeine you drink, or stop drinking caffeine. General instructions  Keep all follow-up visits as told by your health care provider. This is important.  Keep a headache journal to help find out what may trigger your headaches. For example, write down: ? What you eat and drink. ? How much sleep you get. ? Any change to your diet or medicines.  Try massage or other relaxation techniques.  Limit stress.  Sit up straight, and do not tense your muscles.  Do not use tobacco products, including cigarettes, chewing tobacco, or e-cigarettes. If you need help quitting, ask your health care provider.  Exercise regularly as told by your health care provider.  Sleep on a regular schedule. Get 7-9 hours of sleep, or the amount recommended by your health care provider. Contact a health care provider if:  Your symptoms are not helped by medicine.  You have a headache that is different from the usual headache.  You have nausea or you vomit.  You have a fever. Get help right away if:  Your headache becomes severe.  You have repeated vomiting.  You have a stiff neck.  You have a loss of vision.  You have problems with speech.  You have pain in the eye or ear.  You have muscular weakness or loss of muscle control.  You lose your balance or have trouble walking.  You  feel faint or pass out.  You have confusion. This information is not intended to replace advice given to you by your health care provider. Make sure you discuss any questions you have with your health care provider. Document Released: 04/07/2005 Document Revised: 09/13/2015 Document Reviewed: 07/31/2014 Elsevier Interactive Patient Education  2017 [employer].    IF you received an x-ray today, you will receive an invoice from [facility]. Please contact [facility] Radiology at [phone] with  questions or concerns regarding your invoice.   IF you received labwork today, you will receive an invoice from [place]. Please contact [facility] at [phone] with questions or concerns regarding your invoice.   Our billing staff will not be able to assist you with questions regarding bills from these companies.  You will be contacted with the lab results as soon as they are available. The fastest way to get your results is to activate your My Chart account. Instructions are located on the last page of this paperwork. If you have not heard from [place] regarding the results in 2 weeks, please contact this office.

## 2016-12-30 LAB — BASIC METABOLIC PANEL
BUN/Creatinine Ratio: 15 (ref 10–24)
BUN: 21 mg/dL (ref 8–27)
CALCIUM: 9.3 mg/dL (ref 8.6–10.2)
CO2: 22 mmol/L (ref 20–29)
CREATININE: 1.37 mg/dL — AB (ref 0.76–1.27)
Chloride: 106 mmol/L (ref 96–106)
GFR, EST AFRICAN AMERICAN: 59 mL/min/{1.73_m2} — AB (ref >59)
GFR, EST NON AFRICAN AMERICAN: 51 mL/min/{1.73_m2} — AB (ref >59)
Glucose: 94 mg/dL (ref 65–99)
POTASSIUM: 4.1 mmol/L (ref 3.5–5.2)
Sodium: 143 mmol/L (ref 134–144)

## 2017-01-05 ENCOUNTER — Encounter: Payer: Self-pay | Admitting: *Deleted

## 2017-01-12 ENCOUNTER — Encounter: Payer: Self-pay | Admitting: Family Medicine

## 2017-01-12 ENCOUNTER — Ambulatory Visit (INDEPENDENT_AMBULATORY_CARE_PROVIDER_SITE_OTHER): Payer: Medicare Other | Admitting: Family Medicine

## 2017-01-12 DIAGNOSIS — I10 Essential (primary) hypertension: Secondary | ICD-10-CM

## 2017-01-12 MED ORDER — AMLODIPINE BESYLATE 2.5 MG PO TABS: 2.5000 mg | ORAL_TABLET | Freq: Every day | ORAL | 0 refills | Status: DC

## 2017-01-12 MED ORDER — OLMESARTAN MEDOXOMIL-HCTZ 40-12.5 MG PO TABS: 1.0000 | ORAL_TABLET | Freq: Every day | ORAL | 0 refills | Status: DC

## 2017-01-12 NOTE — Patient Instructions (Addendum)
   IF you received an x-ray today, you will receive an invoice from [facility]. Please contact [facility] at [phone] with questions or concerns regarding your invoice.   IF you received labwork today, you will receive an invoice from [facility]. Please contact [facility] at [phone] with questions or concerns regarding your invoice.   Our billing staff will not be able to assist you with questions regarding bills from these companies.  You will be contacted with the lab results as soon as they are available. The fastest way to get your results is to activate your My Chart account. Instructions are located on the last page of this paperwork. If you have not heard from us regarding the results in 2 weeks, please contact this office.     DASH Eating Plan DASH stands for "Dietary Approaches to Stop Hypertension." The DASH eating plan is a healthy eating plan that has been shown to reduce high blood pressure (hypertension). It may also reduce your risk for type 2 diabetes, heart disease, and stroke. The DASH eating plan may also help with weight loss. What are tips for following this plan? General guidelines   Avoid eating more than 2,300 mg (milligrams) of salt (sodium) a day. If you have hypertension, you may need to reduce your sodium intake to 1,500 mg a day.  Limit alcohol intake to no more than 1 drink a day for nonpregnant women and 2 drinks a day for men. One drink equals 12 oz of beer, 5 oz of wine, or 1 oz of hard liquor.  Work with your health care provider to maintain a healthy body weight or to lose weight. Ask what an ideal weight is for you.  Get at least 30 minutes of exercise that causes your heart to beat faster (aerobic exercise) most days of the week. Activities may include walking, swimming, or biking.  Work with your health care provider or diet and nutrition specialist (dietitian) to adjust your eating plan to your individual calorie  needs. Reading food labels   Check food labels for the amount of sodium per serving. Choose foods with less than 5 percent of the Daily Value of sodium. Generally, foods with less than 300 mg of sodium per serving fit into this eating plan.  To find whole grains, look for the word "whole" as the first word in the ingredient list. Shopping   Buy products labeled as "low-sodium" or "no salt added."  Buy fresh foods. Avoid canned foods and premade or frozen meals. Cooking   Avoid adding salt when cooking. Use salt-free seasonings or herbs instead of table salt or sea salt. Check with your health care provider or pharmacist before using salt substitutes.  Do not fry foods. Cook foods using healthy methods such as baking, boiling, grilling, and broiling instead.  Cook with heart-healthy oils, such as olive, canola, soybean, or sunflower oil. Meal planning    Eat a balanced diet that includes:  5 or more servings of fruits and vegetables each day. At each meal, try to fill half of your plate with fruits and vegetables.  Up to 6-8 servings of whole grains each day.  Less than 6 oz of lean meat, poultry, or fish each day. A 3-oz serving of meat is about the same size as a deck of cards. One egg equals 1 oz.  2 servings of low-fat dairy each day.  A serving of nuts, seeds, or beans 5 times each week.  Heart-healthy fats. Healthy fats called   Omega-3 fatty acids are found in foods such as flaxseeds and coldwater fish, like sardines, salmon, and mackerel.  Limit how much you eat of the following:  Canned or prepackaged foods.  Food that is high in trans fat, such as fried foods.  Food that is high in saturated fat, such as fatty meat.  Sweets, desserts, sugary drinks, and other foods with added sugar.  Full-fat dairy products.  Do not salt foods before eating.  Try to eat at least 2 vegetarian meals each week.  Eat more home-cooked food and less restaurant, buffet, and fast  food.  When eating at a restaurant, ask that your food be prepared with less salt or no salt, if possible. What foods are recommended? The items listed may not be a complete list. Talk with your dietitian about what dietary choices are best for you. Grains  Whole-grain or whole-wheat bread. Whole-grain or whole-wheat pasta. Brown rice. Oatmeal. Quinoa. Bulgur. Whole-grain and low-sodium cereals. Pita bread. Low-fat, low-sodium crackers. Whole-wheat flour tortillas. Vegetables  Fresh or frozen vegetables (raw, steamed, roasted, or grilled). Low-sodium or reduced-sodium tomato and vegetable juice. Low-sodium or reduced-sodium tomato sauce and tomato paste. Low-sodium or reduced-sodium canned vegetables. Fruits  All fresh, dried, or frozen fruit. Canned fruit in natural juice (without added sugar). Meat and other protein foods  Skinless chicken or turkey. Ground chicken or turkey. Pork with fat trimmed off. Fish and seafood. Egg whites. Dried beans, peas, or lentils. Unsalted nuts, nut butters, and seeds. Unsalted canned beans. Lean cuts of beef with fat trimmed off. Low-sodium, lean deli meat. Dairy  Low-fat (1%) or fat-free (skim) milk. Fat-free, low-fat, or reduced-fat cheeses. Nonfat, low-sodium ricotta or cottage cheese. Low-fat or nonfat yogurt. Low-fat, low-sodium cheese. Fats and oils  Soft margarine without trans fats. Vegetable oil. Low-fat, reduced-fat, or light mayonnaise and salad dressings (reduced-sodium). Canola, safflower, olive, soybean, and sunflower oils. Avocado. Seasoning and other foods  Herbs. Spices. Seasoning mixes without salt. Unsalted popcorn and pretzels. Fat-free sweets. What foods are not recommended? The items listed may not be a complete list. Talk with your dietitian about what dietary choices are best for you. Grains  Baked goods made with fat, such as croissants, muffins, or some breads. Dry pasta or rice meal packs. Vegetables  Creamed or fried vegetables.  Vegetables in a cheese sauce. Regular canned vegetables (not low-sodium or reduced-sodium). Regular canned tomato sauce and paste (not low-sodium or reduced-sodium). Regular tomato and vegetable juice (not low-sodium or reduced-sodium). Pickles. Olives. Fruits  Canned fruit in a light or heavy syrup. Fried fruit. Fruit in cream or butter sauce. Meat and other protein foods  Fatty cuts of meat. Ribs. Fried meat. Bacon. Sausage. Bologna and other processed lunch meats. Salami. Fatback. Hotdogs. Bratwurst. Salted nuts and seeds. Canned beans with added salt. Canned or smoked fish. Whole eggs or egg yolks. Chicken or turkey with skin. Dairy  Whole or 2% milk, cream, and half-and-half. Whole or full-fat cream cheese. Whole-fat or sweetened yogurt. Full-fat cheese. Nondairy creamers. Whipped toppings. Processed cheese and cheese spreads. Fats and oils  Butter. Stick margarine. Lard. Shortening. Ghee. Bacon fat. Tropical oils, such as coconut, palm kernel, or palm oil. Seasoning and other foods  Salted popcorn and pretzels. Onion salt, garlic salt, seasoned salt, table salt, and sea salt. Worcestershire sauce. Tartar sauce. Barbecue sauce. Teriyaki sauce. Soy sauce, including reduced-sodium. Steak sauce. Canned and packaged gravies. Fish sauce. Oyster sauce. Cocktail sauce. Horseradish that you find on the shelf. Ketchup. Mustard. Meat flavorings   Relishes. Regular salad dressings. Where to find more information:  National Heart, Lung, and [place]: https://wilson-eaton.com/  American Heart Association: www.heart.org Summary  The DASH eating plan is a healthy eating plan that has been shown to reduce high blood pressure (hypertension). It may also reduce your risk for type 2 diabetes, heart disease, and stroke.  With the DASH eating plan, you should limit salt (sodium) intake to 2,300 mg a  day. If you have hypertension, you may need to reduce your sodium intake to 1,500 mg a day.  When on the DASH eating plan, aim to eat more fresh fruits and vegetables, whole grains, lean proteins, low-fat dairy, and heart-healthy fats.  Work with your health care provider or diet and nutrition specialist (dietitian) to adjust your eating plan to your individual calorie needs. This information is not intended to replace advice given to you by your health care provider. Make sure you discuss any questions you have with your health care provider. Document Released: 03/27/2011 Document Revised: 03/31/2016 Document Reviewed: 03/31/2016 Elsevier Interactive Patient Education  2017 [place].   Low-Sodium Eating Plan Sodium, which is an element that makes up salt, helps you maintain a healthy balance of fluids in your body. Too much sodium can increase your blood pressure and cause fluid and waste to be held in your body. Your health care provider or dietitian may recommend following this plan if you have high blood pressure (hypertension), kidney disease, liver disease, or heart failure. Eating less sodium can help lower your blood pressure, reduce swelling, and protect your heart, liver, and kidneys. What are tips for following this plan? General guidelines  Most people on this plan should limit their sodium intake to 1,500-2,000 mg (milligrams) of sodium each day. Reading food labels  The Nutrition Facts label lists the amount of sodium in one serving of the food. If you eat more than one serving, you must multiply the listed amount of sodium by the number of servings.  Choose foods with less than 140 mg of sodium per serving.  Avoid foods with 300 mg of sodium or more per serving. Shopping  Look for lower-sodium products, often labeled as "low-sodium" or "no salt added."  Always check the sodium content even if foods are labeled as "unsalted" or "no salt added".  Buy fresh  foods. ? Avoid canned foods and premade or frozen meals. ? Avoid canned, cured, or processed meats  Buy breads that have less than 80 mg of sodium per slice. Cooking  Eat more home-cooked food and less restaurant, buffet, and fast food.  Avoid adding salt when cooking. Use salt-free seasonings or herbs instead of table salt or sea salt. Check with your health care provider or pharmacist before using salt substitutes.  Cook with plant-based oils, such as canola, sunflower, or olive oil. Meal planning  When eating at a restaurant, ask that your food be prepared with less salt or no salt, if possible.  Avoid foods that contain MSG (monosodium glutamate). MSG is sometimes added to [place] food, bouillon, and some canned foods. What foods are recommended? The items listed may not be a complete list. Talk with your dietitian about what dietary choices are best for you. Grains Low-sodium cereals, including oats, puffed wheat and rice, and shredded wheat. Low-sodium crackers. Unsalted rice. Unsalted pasta. Low-sodium bread. Whole-grain breads and whole-grain pasta. Vegetables Fresh or frozen vegetables. "No salt added" canned vegetables. "No salt added" tomato sauce and paste. Low-sodium or reduced-sodium tomato and vegetable juice. Fruits  Fresh, frozen, or canned fruit. Fruit juice. Meats and other protein foods Fresh or frozen (no salt added) meat, poultry, seafood, and fish. Low-sodium canned tuna and salmon. Unsalted nuts. Dried peas, beans, and lentils without added salt. Unsalted canned beans. Eggs. Unsalted nut butters. Dairy Milk. Soy milk. Cheese that is naturally low in sodium, such as ricotta cheese, fresh mozzarella, or Swiss cheese Low-sodium or reduced-sodium cheese. Cream cheese. Yogurt. Fats and oils Unsalted butter. Unsalted margarine with no trans fat. Vegetable oils such as canola or olive oils. Seasonings and other foods Fresh and dried herbs and spices. Salt-free  seasonings. Low-sodium mustard and ketchup. Sodium-free salad dressing. Sodium-free light mayonnaise. Fresh or refrigerated horseradish. Lemon juice. Vinegar. Homemade, reduced-sodium, or low-sodium soups. Unsalted popcorn and pretzels. Low-salt or salt-free chips. What foods are not recommended? The items listed may not be a complete list. Talk with your dietitian about what dietary choices are best for you. Grains Instant hot cereals. Bread stuffing, pancake, and biscuit mixes. Croutons. Seasoned rice or pasta mixes. Noodle soup cups. Boxed or frozen macaroni and cheese. Regular salted crackers. Self-rising flour. Vegetables Sauerkraut, pickled vegetables, and relishes. Olives. [place] fries. Onion rings. Regular canned vegetables (not low-sodium or reduced-sodium). Regular canned tomato sauce and paste (not low-sodium or reduced-sodium). Regular tomato and vegetable juice (not low-sodium or reduced-sodium). Frozen vegetables in sauces. Meats and other protein foods Meat or fish that is salted, canned, smoked, spiced, or pickled. Bacon, ham, sausage, hotdogs, corned beef, chipped beef, packaged lunch meats, salt pork, jerky, pickled herring, anchovies, regular canned tuna, sardines, salted nuts. Dairy Processed cheese and cheese spreads. Cheese curds. Blue cheese. Feta cheese. String cheese. Regular cottage cheese. Buttermilk. Canned milk. Fats and oils Salted butter. Regular margarine. Ghee. Bacon fat. Seasonings and other foods Onion salt, garlic salt, seasoned salt, table salt, and sea salt. Canned and packaged gravies. Worcestershire sauce. Tartar sauce. Barbecue sauce. Teriyaki sauce. Soy sauce, including reduced-sodium. Steak sauce. Fish sauce. Oyster sauce. Cocktail sauce. Horseradish that you find on the shelf. Regular ketchup and mustard. Meat flavorings and tenderizers. Bouillon cubes. Hot sauce and Tabasco sauce. Premade or packaged marinades. Premade or packaged taco seasonings. Relishes.  Regular salad dressings. Salsa. Potato and tortilla chips. Corn chips and puffs. Salted popcorn and pretzels. Canned or dried soups. Pizza. Frozen entrees and pot pies. Summary  Eating less sodium can help lower your blood pressure, reduce swelling, and protect your heart, liver, and kidneys.  Most people on this plan should limit their sodium intake to 1,500-2,000 mg (milligrams) of sodium each day.  Canned, boxed, and frozen foods are high in sodium. Restaurant foods, fast foods, and pizza are also very high in sodium. You also get sodium by adding salt to food.  Try to cook at home, eat more fresh fruits and vegetables, and eat less fast food, canned, processed, or prepared foods. This information is not intended to replace advice given to you by your health care provider. Make sure you discuss any questions you have with your health care provider. Document Released: 09/27/2001 Document Revised: 03/31/2016 Document Reviewed: 03/31/2016 Elsevier Interactive Patient Education  2017 [employer].

## 2017-01-12 NOTE — Progress Notes (Signed)
   Subjective:    Patient ID: Matthew Wilkerson, male    DOB: November 13, 1945, 71 y.o.   MRN: [id number]  Chief Complaint  Patient presents with  . Medication Management  . Follow-up    HPI  Increased urinary freq with lasix in the a.m. - taking once a day.  No muscle cramps but did in left hand. Is eating to much salt. Checks BP occ at [facility]  Review of Systems     Objective:   Physical Exam    BP (!) 143/84   Pulse 79   Temp 98 F (36.7 C) (Oral)   Resp 18   Ht 5' 8.11" (1.73 m)   Wt 206 lb (93.4 kg)   SpO2 97%   BMI 31.22 kg/m      Assessment & Plan:   1. Essential hypertension     Meds ordered this encounter  Medications  . olmesartan-hydrochlorothiazide (BENICAR HCT) 40-12.5 MG tablet    Sig: Take 1 tablet by mouth daily.    Dispense:  90 tablet    Refill:  0  . amLODipine (NORVASC) 2.5 MG tablet    Sig: Take 1 tablet (2.5 mg total) by mouth daily.    Dispense:  90 tablet    Refill:  0    Delman Cheadle, M.D.  Primary Care at [facility] [address] [place], [place] [address] [phone] phone [phone] fax  01/14/17 11:04 PM

## 2017-01-13 ENCOUNTER — Telehealth: Payer: Self-pay | Admitting: Family Medicine

## 2017-01-13 ENCOUNTER — Telehealth: Payer: Self-pay | Admitting: *Deleted

## 2017-01-13 NOTE — Telephone Encounter (Signed)
Patient came in stating that he saw Dr Brigitte Pulse on 01/12/17 and she told him to stop taking two of his medications but she wrote it on her paperwork and not his and he does not remember which meds they were.   I had John check the chart but there was nothing documented about this. Can someone get Dr Brigitte Pulse to give this patient a call at [phone]

## 2017-01-13 NOTE — Telephone Encounter (Signed)
Attempted to call patient but reached a number that VM was not set up. Attempted to call another number and someone picked up but then hung up. Will try again another time.

## 2017-01-13 NOTE — Telephone Encounter (Signed)
Pt called back to let us know that he is in a place where he can not have his phone on and when we call him back to place the names of medications up front for him to pcik up

## 2017-01-13 NOTE — Telephone Encounter (Signed)
Dr. Brigitte Pulse pt called, his message states that you had told him to stop 2 medications that he was taking. I see in the chart under medication changes Furosemide 20 mg and Lisinopril 40 mg are marked through,are these the meds that pt is referring to. He wants to come by the office to pick up the names of them.

## 2017-01-13 NOTE — Telephone Encounter (Signed)
Patient's request, that a note be left for him to pick up regarding the 2 medications that Dr. Brigitte Pulse wanted him to stop taking. Meds verified by dr. Brigitte Pulse. Furosemide 20 mg, and Lisinopril 40 mg. He was started on Olmesartan-HCTZ 40-12.5 mg.  Note left up front at 102.

## 2017-01-13 NOTE — Telephone Encounter (Signed)
Yes that is correct. And he was started on olmesartan-hctz 40-12.5 instead of those 2.

## 2017-02-14 ENCOUNTER — Other Ambulatory Visit: Payer: Self-pay | Admitting: Family Medicine

## 2017-02-15 NOTE — Telephone Encounter (Signed)
Please see why pt need refill so early. Fine to refill if needed but he was just started on this med 1 mo prior and given a 90d rx at that time - please make sure he is following the sig correctly on his olmesartan-hctz. . . .

## 2017-02-20 NOTE — Progress Notes (Signed)
Subjective:    Patient ID: Matthew Wilkerson, male    DOB: 1945-07-06, 71 y.o.   MRN: [id number]   Chief Complaint  Patient presents with  . Follow-up    blood pressure recheck and pt isn't fasting, also having headaches since medication changes     HPI  Matthew Wilkerson is a delightful 71 yo male who is here ina 6 week follow-up of his HTN med changes.  HTN: Had been on amlodipine 10 but d/c'd > 2 mos ago when pt presented with sig Rt>Lt lower ext recurrent edema. Edema resolved to trace so was restarted at 2.5 2 wks later when BP was still elevated and pt was c/o a HA. 6 wks prior, BP still elev in 140s/80s so d/c furosemide 23m and lisinopril 449mand started on olmesartan-hctz 40-12.5. Continued amlodipine 2.5 qd and toprol 100 qd, asa 81 qd. He does admit to eating a lot of salt which he is trying to cut down. [facility] Baseline Cr 1.24-1.37 Pt is still c/o of a HA which he attributes to a BP med since he never had prob w/ HA prior - HA started 2 months ago when we stopped his amlodipine and started lisinopril.  He will often wake up with a HA around his right eye and behind both of his eyebrows.  He now remembers that he had similar HAs 1-2 years prior. He has been taking BC powder and ES tylenol which helps but he doesn't like to take the medicines.  Dr. DaEverlene Farrieras rx'd him tramadol prior which was highly effective. He is not taking any otc allergy medications though he does sneeze a lot.   HLD: On lipitor 20 - did hold x 6 wks when pt was having atypical diffuse rash, now resolved and resumed lipitor > 2 mos ago. Lipid panel last checked 2 yrs prior 12/2014 after which he was started on atorvastatin as LDL 135, non-HDL 164  H/o prostate cancer: [facility] S/p radiation therapy in 2014.  - last OV w/ urology 09/2014. Pt was advised to cont to f/u with urology for PSA and DRE every  6 mos but has been 2 years since his  last visit so he was referred back to Dr. GrRisa Grill 2 mos ago. Also has ED - was advised he could try genetic viagra Had some BPH with urinary obstruction sxs - slowing of the stream, nocturia of 2x/night - tried on flomax 2 yrs ago   Past Medical History:  Diagnosis Date  . Abnormal PSA 09/2011   7.63,   . Allergy   . BPH (benign prostatic hyperplasia)   . Elevated PSA   . Hernia   . Hyperlipidemia   . Hypertension   . Inguinal hernia    left   . [facility]   bx=Adenocarcinoma, gleason 6 (3+3), prostate volume 43 cc  . [facility]   prostate ca,PSA=7.2 T1c volume=30cc  . S/P radiation therapy within four to twelve weeks 08/23/12-10/18/12   prostate,7800cGy/40 sessions  . Urinary obstruction    Past Surgical History:  Procedure Laterality Date  . HERNIA REPAIR     right inguinal done in early 70's  . PROSTATE BIOPSY  01/08/12   Adenocarcinoma,gleason=3+3=6,& 3+4=7,volume=43cc    Current Outpatient Medications on File Prior to Visit  Medication Sig Dispense Refill  . atorvastatin (LIPITOR) 20 MG tablet Take 1 tablet (20 mg  total) by mouth daily. 90 tablet 3  . metoprolol succinate (TOPROL-XL) 100 MG 24 hr tablet TAKE 1 TABLET (100 MG TOTAL) BY MOUTH DAILY. TAKE WITH OR IMMEDIATELY FOLLOWING A MEAL. 90 tablet 3   No current facility-administered medications on file prior to visit.    Allergies  Allergen Reactions  . Aspirin Nausea Only    High dose asp.... He tolerates 81 mg   . Codeine Other (See Comments)    Derivatives, "causes my sinuses to hurt"   Family History  Problem Relation Age of Onset  . Cancer Sister 32       pancreatic   Social History   Socioeconomic History  . Marital status: Divorced    Spouse name: None  . Number of children: 2  . Years of education: None  . Highest education level: None  Social Needs  . Financial resource strain: None  . Food insecurity - worry: None  . Food insecurity - inability: None  .  Transportation needs - medical: None  . Transportation needs - non-medical: None  Occupational History  . 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[employer]  Tobacco Use  . Smoking status: Never Smoker  . Smokeless tobacco: Never Used  Substance and Sexual Activity  . Alcohol use: No  . Drug use: Yes    Types: MDMA (Ecstacy)  . Sexual activity: Yes  Other Topics Concern  . None  Social History Narrative  . None   Depression screen [facility] 2/9 01/12/2017 12/29/2016 12/15/2016 11/15/2016 10/30/2016  Decreased Interest 0 0 0 0 0  Down, Depressed, Hopeless 0 0 0 0 0  PHQ - 2 Score 0 0 0 0 0  Altered sleeping - - - - -  Tired, decreased energy - - - - -  Change in appetite - - - - -  Feeling bad or failure about yourself  - - - - -  Trouble concentrating - - - - -  Moving slowly or fidgety/restless - - - - -  Suicidal thoughts - - - - -  PHQ-9 Score - - - - -    Review of Systems See hpi    Objective:   Physical Exam  Constitutional: He is oriented to person, place, and time. He appears well-developed and well-nourished. No distress.  HENT:  Head: Normocephalic and atraumatic.  Eyes: Conjunctivae are normal. Pupils are equal, round, and reactive to light. No scleral icterus.  Neck: Normal range of motion. Neck supple. No thyromegaly present.  Cardiovascular: Normal rate, regular rhythm, normal heart sounds and intact distal pulses.  Pulmonary/Chest: Effort normal and breath sounds normal. No respiratory distress.  Musculoskeletal: He exhibits no edema.  Lymphadenopathy:    He has no cervical adenopathy.  Neurological: He is alert and oriented to person, place, and time.  Skin: Skin is warm and dry. He is not diaphoretic.  Psychiatric: He has a normal mood and affect. His behavior is normal.      BP 132/80   Pulse 84   Temp 97.6 F (36.4 C)   Resp 16   Ht 5' 8.11" (1.73 m)   Wt 207 lb (93.9 kg)   SpO2 96%   BMI 31.37  kg/m      Assessment & Plan:  If edema - could consider changing amlodipine to felodipine or  See urology?? Cmp,  Lipid at f/u - pt not fasting today.  1. Essential hypertension   2. Pure hypercholesterolemia  3. Medication monitoring encounter   4. Prediabetes   5. Acute nonintractable headache, unspecified headache type     Orders Placed This Encounter  Procedures  . Comprehensive metabolic panel    Order Specific Question:   Has the patient fasted?    Answer:   No  . Hemoglobin A1c  . Sedimentation Rate    Meds ordered this encounter  Medications  . olmesartan-hydrochlorothiazide (BENICAR HCT) 40-25 MG tablet    Sig: Take 1 tablet daily by mouth.    Dispense:  90 tablet    Refill:  0    D/C prior rx for lisinopril and for olmesartan-hctz 40-12.5  . cetirizine (ZYRTEC) 10 MG tablet    Sig: Take 1 tablet (10 mg total) at bedtime by mouth. For 2 to 4 weeks to prevent sinus headaches    Dispense:  30 tablet    Refill:  1  . traMADol (ULTRAM) 50 MG tablet    Sig: Take 1 tablet (50 mg total) every 6 (six) hours as needed by mouth.    Dispense:  20 tablet    Refill:  0     Delman Cheadle, M.D.  Primary Care at [facility] [address] [place], [place] [address] [phone] phone [phone] fax  02/26/17 6:58 AM

## 2017-02-21 ENCOUNTER — Ambulatory Visit: Payer: Medicare Other | Admitting: Family Medicine

## 2017-02-23 ENCOUNTER — Ambulatory Visit (INDEPENDENT_AMBULATORY_CARE_PROVIDER_SITE_OTHER): Payer: Medicare Other | Admitting: Family Medicine

## 2017-02-23 ENCOUNTER — Encounter: Payer: Self-pay | Admitting: Family Medicine

## 2017-02-23 ENCOUNTER — Other Ambulatory Visit: Payer: Self-pay

## 2017-02-23 VITALS — BP 132/80 | HR 84 | Temp 97.6°F | Resp 16 | Ht 68.11 in | Wt 207.0 lb

## 2017-02-23 DIAGNOSIS — R51 Headache: Secondary | ICD-10-CM | POA: Diagnosis not present

## 2017-02-23 DIAGNOSIS — R7303 Prediabetes: Secondary | ICD-10-CM

## 2017-02-23 DIAGNOSIS — E7800 Pure hypercholesterolemia, unspecified: Secondary | ICD-10-CM | POA: Diagnosis not present

## 2017-02-23 DIAGNOSIS — R519 Headache, unspecified: Secondary | ICD-10-CM

## 2017-02-23 DIAGNOSIS — I10 Essential (primary) hypertension: Secondary | ICD-10-CM | POA: Diagnosis not present

## 2017-02-23 DIAGNOSIS — Z5181 Encounter for therapeutic drug level monitoring: Secondary | ICD-10-CM | POA: Diagnosis not present

## 2017-02-23 MED ORDER — CETIRIZINE HCL 10 MG PO TABS: 10.0000 mg | ORAL_TABLET | Freq: Every day | ORAL | 1 refills | Status: DC

## 2017-02-23 MED ORDER — TRAMADOL HCL 50 MG PO TABS: 50.0000 mg | ORAL_TABLET | Freq: Four times a day (QID) | ORAL | 0 refills | Status: DC | PRN

## 2017-02-23 MED ORDER — OLMESARTAN MEDOXOMIL-HCTZ 40-25 MG PO TABS: 1.0000 | ORAL_TABLET | Freq: Every day | ORAL | 0 refills | Status: DC

## 2017-02-23 NOTE — Patient Instructions (Addendum)
Call [facility] - Dr. [name] - to schedule a follow-up appointment to check on your previous prostate cancer.  Stop the lisinopril. After your current bottles of olmesartan-hctz 40-12.5 that you have at home are done, then change to [facility] 40-25 which has been sent to your pharmacy.  Start cetirizine (zyrtec) every night before bed for at least 2-4 weeks. Both Dr. Everlene Farrier and I suspect these might be sinus/allergy headaches.  Use the tramadol as needed to treat the pain when you do develop a headache.   Recheck in 3 months with FASTING labs to check on your cholesterol.      IF you received an x-ray today, you will receive an invoice from [facility]. Please contact [facility] at [phone] with questions or concerns regarding your invoice.   IF you received labwork today, you will receive an invoice from [place]. Please contact [facility] at [phone] with questions or concerns regarding your invoice.   Our billing staff will not be able to assist you with questions regarding bills from these companies.  You will be contacted with the lab results as soon as they are available. The fastest way to get your results is to activate your My Chart account. Instructions are located on the last page of this paperwork. If you have not heard from [place] regarding the results in 2 weeks, please contact this office.     Managing Your Hypertension Hypertension is commonly called high blood pressure. This is when the force of your blood pressing against the walls of your arteries is too strong. Arteries are blood vessels that carry blood from your heart throughout your body. Hypertension forces the heart to work harder to pump blood, and may cause the arteries to become narrow or stiff. Having untreated or uncontrolled hypertension can cause heart attack, stroke, kidney disease, and other problems. What are blood pressure readings? A blood pressure reading consists of a  higher number over a lower number. Ideally, your blood pressure should be below 120/80. The first ("top") number is called the systolic pressure. It is a measure of the pressure in your arteries as your heart beats. The second ("bottom") number is called the diastolic pressure. It is a measure of the pressure in your arteries as the heart relaxes. What does my blood pressure reading mean? Blood pressure is classified into four stages. Based on your blood pressure reading, your health care provider may use the following stages to determine what type of treatment you need, if any. Systolic pressure and diastolic pressure are measured in a unit called mm Hg. Normal  Systolic pressure: below 329.  Diastolic pressure: below 80. Elevated  Systolic pressure: 518-841.  Diastolic pressure: below 80. Hypertension stage 1  Systolic pressure: 660-630.  Diastolic pressure: 16-01. Hypertension stage 2  Systolic pressure: 093 or above.  Diastolic pressure: 90 or above. What health risks are associated with hypertension? Managing your hypertension is an important responsibility. Uncontrolled hypertension can lead to:  A heart attack.  A stroke.  A weakened blood vessel (aneurysm).  Heart failure.  Kidney damage.  Eye damage.  Metabolic syndrome.  Memory and concentration problems.  What changes can I make to manage my hypertension? Hypertension can be managed by making lifestyle changes and possibly by taking medicines. Your health care provider will help you make a plan to bring your blood pressure within a normal range. Eating and drinking  Eat a diet that is high in fiber and potassium, and low in salt (sodium), added sugar,  and fat. An example eating plan is called the DASH (Dietary Approaches to Stop Hypertension) diet. To eat this way: ? Eat plenty of fresh fruits and vegetables. Try to fill half of your plate at each meal with fruits and vegetables. ? Eat whole grains, such as  whole wheat pasta, brown rice, or whole grain bread. Fill about one quarter of your plate with whole grains. ? Eat low-fat diary products. ? Avoid fatty cuts of meat, processed or cured meats, and poultry with skin. Fill about one quarter of your plate with lean proteins such as fish, chicken without skin, beans, eggs, and tofu. ? Avoid premade and processed foods. These tend to be higher in sodium, added sugar, and fat.  Reduce your daily sodium intake. Most people with hypertension should eat less than 1,500 mg of sodium a day.  Limit alcohol intake to no more than 1 drink a day for nonpregnant women and 2 drinks a day for men. One drink equals 12 oz of beer, 5 oz of wine, or 1 oz of hard liquor. Lifestyle  Work with your health care provider to maintain a healthy body weight, or to lose weight. Ask what an ideal weight is for you.  Get at least 30 minutes of exercise that causes your heart to beat faster (aerobic exercise) most days of the week. Activities may include walking, swimming, or biking.  Include exercise to strengthen your muscles (resistance exercise), such as weight lifting, as part of your weekly exercise routine. Try to do these types of exercises for 30 minutes at least 3 days a week.  Do not use any products that contain nicotine or tobacco, such as cigarettes and e-cigarettes. If you need help quitting, ask your health care provider.  Control any long-term (chronic) conditions you have, such as high cholesterol or diabetes. Monitoring  Monitor your blood pressure at home as told by your health care provider. Your personal target blood pressure may vary depending on your medical conditions, your age, and other factors.  Have your blood pressure checked regularly, as often as told by your health care provider. Working with your health care provider  Review all the medicines you take with your health care provider because there may be side effects or interactions.  Talk  with your health care provider about your diet, exercise habits, and other lifestyle factors that may be contributing to hypertension.  Visit your health care provider regularly. Your health care provider can help you create and adjust your plan for managing hypertension. Will I need medicine to control my blood pressure? Your health care provider may prescribe medicine if lifestyle changes are not enough to get your blood pressure under control, and if:  Your systolic blood pressure is 130 or higher.  Your diastolic blood pressure is 80 or higher.  Take medicines only as told by your health care provider. Follow the directions carefully. Blood pressure medicines must be taken as prescribed. The medicine does not work as well when you skip doses. Skipping doses also puts you at risk for problems. Contact a health care provider if:  You think you are having a reaction to medicines you have taken.  You have repeated (recurrent) headaches.  You feel dizzy.  You have swelling in your ankles.  You have trouble with your vision. Get help right away if:  You develop a severe headache or confusion.  You have unusual weakness or numbness, or you feel faint.  You have severe pain in your chest  or abdomen.  You vomit repeatedly.  You have trouble breathing. Summary  Hypertension is when the force of blood pumping through your arteries is too strong. If this condition is not controlled, it may put you at risk for serious complications.  Your personal target blood pressure may vary depending on your medical conditions, your age, and other factors. For most people, a normal blood pressure is less than 120/80.  Hypertension is managed by lifestyle changes, medicines, or both. Lifestyle changes include weight loss, eating a healthy, low-sodium diet, exercising more, and limiting alcohol. This information is not intended to replace advice given to you by your health care provider. Make sure  you discuss any questions you have with your health care provider. Document Released: 12/31/2011 Document Revised: 03/05/2016 Document Reviewed: 03/05/2016 Elsevier Interactive Patient Education  2018 [place] Eating Plan DASH stands for "Dietary Approaches to Stop Hypertension." The DASH eating plan is a healthy eating plan that has been shown to reduce high blood pressure (hypertension). It may also reduce your risk for type 2 diabetes, heart disease, and stroke. The DASH eating plan may also help with weight loss. What are tips for following this plan? General guidelines  Avoid eating more than 2,300 mg (milligrams) of salt (sodium) a day. If you have hypertension, you may need to reduce your sodium intake to 1,500 mg a day.  Limit alcohol intake to no more than 1 drink a day for nonpregnant women and 2 drinks a day for men. One drink equals 12 oz of beer, 5 oz of wine, or 1 oz of hard liquor.  Work with your health care provider to maintain a healthy body weight or to lose weight. Ask what an ideal weight is for you.  Get at least 30 minutes of exercise that causes your heart to beat faster (aerobic exercise) most days of the week. Activities may include walking, swimming, or biking.  Work with your health care provider or diet and nutrition specialist (dietitian) to adjust your eating plan to your individual calorie needs. Reading food labels  Check food labels for the amount of sodium per serving. Choose foods with less than 5 percent of the Daily Value of sodium. Generally, foods with less than 300 mg of sodium per serving fit into this eating plan.  To find whole grains, look for the word "whole" as the first word in the ingredient list. Shopping  Buy products labeled as "low-sodium" or "no salt added."  Buy fresh foods. Avoid canned foods and premade or frozen meals. Cooking  Avoid adding salt when cooking. Use salt-free seasonings or herbs instead of table salt or  sea salt. Check with your health care provider or pharmacist before using salt substitutes.  Do not fry foods. Cook foods using healthy methods such as baking, boiling, grilling, and broiling instead.  Cook with heart-healthy oils, such as olive, canola, soybean, or sunflower oil. Meal planning   Eat a balanced diet that includes: ? 5 or more servings of fruits and vegetables each day. At each meal, try to fill half of your plate with fruits and vegetables. ? Up to 6-8 servings of whole grains each day. ? Less than 6 oz of lean meat, poultry, or fish each day. A 3-oz serving of meat is about the same size as a deck of cards. One egg equals 1 oz. ? 2 servings of low-fat dairy each day. ? A serving of nuts, seeds, or beans 5 times each week. ? Heart-healthy  fats. Healthy fats called Omega-3 fatty acids are found in foods such as flaxseeds and coldwater fish, like sardines, salmon, and mackerel.  Limit how much you eat of the following: ? Canned or prepackaged foods. ? Food that is high in trans fat, such as fried foods. ? Food that is high in saturated fat, such as fatty meat. ? Sweets, desserts, sugary drinks, and other foods with added sugar. ? Full-fat dairy products.  Do not salt foods before eating.  Try to eat at least 2 vegetarian meals each week.  Eat more home-cooked food and less restaurant, buffet, and fast food.  When eating at a restaurant, ask that your food be prepared with less salt or no salt, if possible. What foods are recommended? The items listed may not be a complete list. Talk with your dietitian about what dietary choices are best for you. Grains Whole-grain or whole-wheat bread. Whole-grain or whole-wheat pasta. Brown rice. [place]. Bulgur. Whole-grain and low-sodium cereals. Pita bread. Low-fat, low-sodium crackers. Whole-wheat flour tortillas. Vegetables Fresh or frozen vegetables (raw, steamed, roasted, or grilled). Low-sodium or reduced-sodium  tomato and vegetable juice. Low-sodium or reduced-sodium tomato sauce and tomato paste. Low-sodium or reduced-sodium canned vegetables. Fruits All fresh, dried, or frozen fruit. Canned fruit in natural juice (without added sugar). Meat and other protein foods Skinless chicken or [place]. Ground chicken or [place]. Pork with fat trimmed off. Fish and seafood. Egg whites. Dried beans, peas, or lentils. Unsalted nuts, nut butters, and seeds. Unsalted canned beans. Lean cuts of beef with fat trimmed off. Low-sodium, lean deli meat. Dairy Low-fat (1%) or fat-free (skim) milk. Fat-free, low-fat, or reduced-fat cheeses. Nonfat, low-sodium ricotta or cottage cheese. Low-fat or nonfat yogurt. Low-fat, low-sodium cheese. Fats and oils Soft margarine without trans fats. Vegetable oil. Low-fat, reduced-fat, or light mayonnaise and salad dressings (reduced-sodium). Canola, safflower, olive, soybean, and sunflower oils. Avocado. Seasoning and other foods Herbs. Spices. Seasoning mixes without salt. Unsalted popcorn and pretzels. Fat-free sweets. What foods are not recommended? The items listed may not be a complete list. Talk with your dietitian about what dietary choices are best for you. Grains Baked goods made with fat, such as croissants, muffins, or some breads. Dry pasta or rice meal packs. Vegetables Creamed or fried vegetables. Vegetables in a cheese sauce. Regular canned vegetables (not low-sodium or reduced-sodium). Regular canned tomato sauce and paste (not low-sodium or reduced-sodium). Regular tomato and vegetable juice (not low-sodium or reduced-sodium). Angie Fava. Olives. Fruits Canned fruit in a light or heavy syrup. Fried fruit. Fruit in cream or butter sauce. Meat and other protein foods Fatty cuts of meat. Ribs. Fried meat. Berniece Salines. Sausage. Bologna and other processed lunch meats. Salami. Fatback. Hotdogs. Bratwurst. Salted nuts and seeds. Canned beans with added salt. Canned or smoked fish.  Whole eggs or egg yolks. Chicken or [place] with skin. Dairy Whole or 2% milk, cream, and half-and-half. Whole or full-fat cream cheese. Whole-fat or sweetened yogurt. Full-fat cheese. Nondairy creamers. Whipped toppings. Processed cheese and cheese spreads. Fats and oils Butter. Stick margarine. Lard. Shortening. Ghee. Bacon fat. Tropical oils, such as coconut, palm kernel, or palm oil. Seasoning and other foods Salted popcorn and pretzels. Onion salt, garlic salt, seasoned salt, table salt, and sea salt. Worcestershire sauce. Tartar sauce. Barbecue sauce. Teriyaki sauce. Soy sauce, including reduced-sodium. Steak sauce. Canned and packaged gravies. Fish sauce. Oyster sauce. Cocktail sauce. Horseradish that you find on the shelf. Ketchup. Mustard. Meat flavorings and tenderizers. Bouillon cubes. Hot sauce and Tabasco sauce. Premade  or packaged marinades. Premade or packaged taco seasonings. Relishes. Regular salad dressings. Where to find more information:  National Heart, Lung, and [place]: https://wilson-eaton.com/  American Heart Association: www.heart.org Summary  The DASH eating plan is a healthy eating plan that has been shown to reduce high blood pressure (hypertension). It may also reduce your risk for type 2 diabetes, heart disease, and stroke.  With the DASH eating plan, you should limit salt (sodium) intake to 2,300 mg a day. If you have hypertension, you may need to reduce your sodium intake to 1,500 mg a day.  When on the DASH eating plan, aim to eat more fresh fruits and vegetables, whole grains, lean proteins, low-fat dairy, and heart-healthy fats.  Work with your health care provider or diet and nutrition specialist (dietitian) to adjust your eating plan to your individual calorie needs. This information is not intended to replace advice given to you by your health care provider. Make sure you discuss any questions you have with your health care provider. Document Released:  03/27/2011 Document Revised: 03/31/2016 Document Reviewed: 03/31/2016 Elsevier Interactive Patient Education  2017 [employer].

## 2017-02-24 LAB — COMPREHENSIVE METABOLIC PANEL
A/G RATIO: 1.7 (ref 1.2–2.2)
ALBUMIN: 4.5 g/dL (ref 3.5–4.8)
ALT: 28 IU/L (ref 0–44)
AST: 31 IU/L (ref 0–40)
Alkaline Phosphatase: 87 IU/L (ref 39–117)
BILIRUBIN TOTAL: 0.3 mg/dL (ref 0.0–1.2)
BUN / CREAT RATIO: 21 (ref 10–24)
BUN: 29 mg/dL — AB (ref 8–27)
CHLORIDE: 106 mmol/L (ref 96–106)
CO2: 24 mmol/L (ref 20–29)
Calcium: 9.8 mg/dL (ref 8.6–10.2)
Creatinine, Ser: 1.4 mg/dL — ABNORMAL HIGH (ref 0.76–1.27)
GFR calc non Af Amer: 49 mL/min/{1.73_m2} — ABNORMAL LOW (ref >59)
GFR, EST AFRICAN AMERICAN: 57 mL/min/{1.73_m2} — AB (ref >59)
GLOBULIN, TOTAL: 2.7 g/dL (ref 1.5–4.5)
GLUCOSE: 107 mg/dL — AB (ref 65–99)
POTASSIUM: 4.2 mmol/L (ref 3.5–5.2)
Sodium: 144 mmol/L (ref 134–144)
TOTAL PROTEIN: 7.2 g/dL (ref 6.0–8.5)

## 2017-02-24 LAB — HEMOGLOBIN A1C
Est. average glucose Bld gHb Est-mCnc: 128 mg/dL
Hgb A1c MFr Bld: 6.1 % — ABNORMAL HIGH (ref 4.8–5.6)

## 2017-02-24 LAB — SEDIMENTATION RATE: Sed Rate: 28 mm/hr (ref 0–30)

## 2017-03-19 ENCOUNTER — Other Ambulatory Visit: Payer: Self-pay

## 2017-03-19 ENCOUNTER — Ambulatory Visit (INDEPENDENT_AMBULATORY_CARE_PROVIDER_SITE_OTHER): Payer: Medicare Other | Admitting: Emergency Medicine

## 2017-03-19 ENCOUNTER — Encounter: Payer: Self-pay | Admitting: Emergency Medicine

## 2017-03-19 VITALS — BP 203/123 | HR 74 | Temp 98.0°F | Resp 16 | Ht 67.0 in | Wt 204.8 lb

## 2017-03-19 DIAGNOSIS — R51 Headache: Secondary | ICD-10-CM | POA: Diagnosis not present

## 2017-03-19 DIAGNOSIS — J3489 Other specified disorders of nose and nasal sinuses: Secondary | ICD-10-CM | POA: Diagnosis not present

## 2017-03-19 DIAGNOSIS — I10 Essential (primary) hypertension: Secondary | ICD-10-CM

## 2017-03-19 DIAGNOSIS — R519 Headache, unspecified: Secondary | ICD-10-CM

## 2017-03-19 MED ORDER — HYDROCODONE-ACETAMINOPHEN 5-325 MG PO TABS: 1.0000 | ORAL_TABLET | Freq: Four times a day (QID) | ORAL | 0 refills | Status: DC | PRN

## 2017-03-19 MED ORDER — AMOXICILLIN-POT CLAVULANATE 875-125 MG PO TABS: 1.0000 | ORAL_TABLET | Freq: Two times a day (BID) | ORAL | 0 refills | Status: AC

## 2017-03-19 NOTE — Patient Instructions (Addendum)
Stop Cetrizine and Tramadol. Start taking blood pressure medication today. Return if worse.   IF you received an x-ray today, you will receive an invoice from [facility]. Please contact [facility] at [phone] with questions or concerns regarding your invoice.   IF you received labwork today, you will receive an invoice from [facility]. Please contact [facility] at [phone] with questions or concerns regarding your invoice.   Our billing staff will not be able to assist you with questions regarding bills from these companies.  You will be contacted with the lab results as soon as they are available. The fastest way to get your results is to activate your My Chart account. Instructions are located on the last page of this paperwork. If you have not heard from [place] regarding the results in 2 weeks, please contact this office.     General Headache Without Cause A headache is pain or discomfort felt around the head or neck area. There are many causes and types of headaches. In some cases, the cause may not be found. Follow these instructions at home: Managing pain  Take over-the-counter and prescription medicines only as told by your doctor.  Lie down in a dark, quiet room when you have a headache.  If directed, apply ice to the head and neck area: ? Put ice in a plastic bag. ? Place a towel between your skin and the bag. ? Leave the ice on for 20 minutes, 2-3 times per day.  Use a heating pad or hot shower to apply heat to the head and neck area as told by your doctor.  Keep lights dim if bright lights bother you or make your headaches worse. Eating and drinking  Eat meals on a regular schedule.  Lessen how much alcohol you drink.  Lessen how much caffeine you drink, or stop drinking caffeine. General instructions  Keep all follow-up visits as told by your doctor. This is important.  Keep a journal to find out if certain things bring on headaches. For  example, write down: ? What you eat and drink. ? How much sleep you get. ? Any change to your diet or medicines.  Relax by getting a massage or doing other relaxing activities.  Lessen stress.  Sit up straight. Do not tighten (tense) your muscles.  Do not use tobacco products. This includes cigarettes, chewing tobacco, or e-cigarettes. If you need help quitting, ask your doctor.  Exercise regularly as told by your doctor.  Get enough sleep. This often means 7-9 hours of sleep. Contact a doctor if:  Your symptoms are not helped by medicine.  You have a headache that feels different than the other headaches.  You feel sick to your stomach (nauseous) or you throw up (vomit).  You have a fever. Get help right away if:  Your headache becomes really bad.  You keep throwing up.  You have a stiff neck.  You have trouble seeing.  You have trouble speaking.  You have pain in the eye or ear.  Your muscles are weak or you lose muscle control.  You lose your balance or have trouble walking.  You feel like you will pass out (faint) or you pass out.  You have confusion. This information is not intended to replace advice given to you by your health care provider. Make sure you discuss any questions you have with your health care provider. Document Released: 01/15/2008 Document Revised: 09/13/2015 Document Reviewed: 07/31/2014 Elsevier Interactive Patient Education  [employer].  Hypertension Hypertension, commonly called high blood pressure, is when the force of blood pumping through the arteries is too strong. The arteries are the blood vessels that carry blood from the heart throughout the body. Hypertension forces the heart to work harder to pump blood and may cause arteries to become narrow or stiff. Having untreated or uncontrolled hypertension can cause heart attacks, strokes, kidney disease, and other problems. A blood pressure reading consists of a higher number  over a lower number. Ideally, your blood pressure should be below 120/80. The first ("top") number is called the systolic pressure. It is a measure of the pressure in your arteries as your heart beats. The second ("bottom") number is called the diastolic pressure. It is a measure of the pressure in your arteries as the heart relaxes. What are the causes? The cause of this condition is not known. What increases the risk? Some risk factors for high blood pressure are under your control. Others are not. Factors you can change  Smoking.  Having type 2 diabetes mellitus, high cholesterol, or both.  Not getting enough exercise or physical activity.  Being overweight.  Having too much fat, sugar, calories, or salt (sodium) in your diet.  Drinking too much alcohol. Factors that are difficult or impossible to change  Having chronic kidney disease.  Having a family history of high blood pressure.  Age. Risk increases with age.  Race. You may be at higher risk if you are African-American.  Gender. Men are at higher risk than women before age 63. After age 25, women are at higher risk than men.  Having obstructive sleep apnea.  Stress. What are the signs or symptoms? Extremely high blood pressure (hypertensive crisis) may cause:  Headache.  Anxiety.  Shortness of breath.  Nosebleed.  Nausea and vomiting.  Severe chest pain.  Jerky movements you cannot control (seizures).  How is this diagnosed? This condition is diagnosed by measuring your blood pressure while you are seated, with your arm resting on a surface. The cuff of the blood pressure monitor will be placed directly against the skin of your upper arm at the level of your heart. It should be measured at least twice using the same arm. Certain conditions can cause a difference in blood pressure between your right and left arms. Certain factors can cause blood pressure readings to be lower or higher than normal (elevated)  for a short period of time:  When your blood pressure is higher when you are in a health care provider's office than when you are at home, this is called white coat hypertension. Most people with this condition do not need medicines.  When your blood pressure is higher at home than when you are in a health care provider's office, this is called masked hypertension. Most people with this condition may need medicines to control blood pressure.  If you have a high blood pressure reading during one visit or you have normal blood pressure with other risk factors:  You may be asked to return on a different day to have your blood pressure checked again.  You may be asked to monitor your blood pressure at home for 1 week or longer.  If you are diagnosed with hypertension, you may have other blood or imaging tests to help your health care provider understand your overall risk for other conditions. How is this treated? This condition is treated by making healthy lifestyle changes, such as eating healthy foods, exercising more, and reducing your alcohol intake.  Your health care provider may prescribe medicine if lifestyle changes are not enough to get your blood pressure under control, and if:  Your systolic blood pressure is above 130.  Your diastolic blood pressure is above 80.  Your personal target blood pressure may vary depending on your medical conditions, your age, and other factors. Follow these instructions at home: Eating and drinking  Eat a diet that is high in fiber and potassium, and low in sodium, added sugar, and fat. An example eating plan is called the DASH (Dietary Approaches to Stop Hypertension) diet. To eat this way: ? Eat plenty of fresh fruits and vegetables. Try to fill half of your plate at each meal with fruits and vegetables. ? Eat whole grains, such as whole wheat pasta, brown rice, or whole grain bread. Fill about one quarter of your plate with whole grains. ? Eat or  drink low-fat dairy products, such as skim milk or low-fat yogurt. ? Avoid fatty cuts of meat, processed or cured meats, and poultry with skin. Fill about one quarter of your plate with lean proteins, such as fish, chicken without skin, beans, eggs, and tofu. ? Avoid premade and processed foods. These tend to be higher in sodium, added sugar, and fat.  Reduce your daily sodium intake. Most people with hypertension should eat less than 1,500 mg of sodium a day.  Limit alcohol intake to no more than 1 drink a day for nonpregnant women and 2 drinks a day for men. One drink equals 12 oz of beer, 5 oz of wine, or 1 oz of hard liquor. Lifestyle  Work with your health care provider to maintain a healthy body weight or to lose weight. Ask what an ideal weight is for you.  Get at least 30 minutes of exercise that causes your heart to beat faster (aerobic exercise) most days of the week. Activities may include walking, swimming, or biking.  Include exercise to strengthen your muscles (resistance exercise), such as pilates or lifting weights, as part of your weekly exercise routine. Try to do these types of exercises for 30 minutes at least 3 days a week.  Do not use any products that contain nicotine or tobacco, such as cigarettes and e-cigarettes. If you need help quitting, ask your health care provider.  Monitor your blood pressure at home as told by your health care provider.  Keep all follow-up visits as told by your health care provider. This is important. Medicines  Take over-the-counter and prescription medicines only as told by your health care provider. Follow directions carefully. Blood pressure medicines must be taken as prescribed.  Do not skip doses of blood pressure medicine. Doing this puts you at risk for problems and can make the medicine less effective.  Ask your health care provider about side effects or reactions to medicines that you should watch for. Contact a health care  provider if:  You think you are having a reaction to a medicine you are taking.  You have headaches that keep coming back (recurring).  You feel dizzy.  You have swelling in your ankles.  You have trouble with your vision. Get help right away if:  You develop a severe headache or confusion.  You have unusual weakness or numbness.  You feel faint.  You have severe pain in your chest or abdomen.  You vomit repeatedly.  You have trouble breathing. Summary  Hypertension is when the force of blood pumping through your arteries is too strong. If this condition is  not controlled, it may put you at risk for serious complications.  Your personal target blood pressure may vary depending on your medical conditions, your age, and other factors. For most people, a normal blood pressure is less than 120/80.  Hypertension is treated with lifestyle changes, medicines, or a combination of both. Lifestyle changes include weight loss, eating a healthy, low-sodium diet, exercising more, and limiting alcohol. This information is not intended to replace advice given to you by your health care provider. Make sure you discuss any questions you have with your health care provider. Document Released: 04/07/2005 Document Revised: 03/05/2016 Document Reviewed: 03/05/2016 Elsevier Interactive Patient Education  [employer].

## 2017-03-19 NOTE — Progress Notes (Signed)
Matthew Wilkerson 71 y.o.   Chief Complaint  Patient presents with  . Headache    x 2-3 days per patient over R/L eye sockets    HISTORY OF PRESENT ILLNESS: This is a 71 y.o. male complaining of recurrent intermittent headaches to frontal head; seen here last 11/5 and prescribed Tramadol and Zyrtec but no relief. Pt has h/o chronic headaches. Missed 2 doses of Benicar/HCT. No other significant symptoms.  HPI   Prior to Admission medications   Medication Sig Start Date End Date Taking? Authorizing Provider  atorvastatin (LIPITOR) 20 MG tablet Take 1 tablet (20 mg total) by mouth daily. 12/15/16  Yes Shawnee Knapp, MD  cetirizine (ZYRTEC) 10 MG tablet Take 1 tablet (10 mg total) at bedtime by mouth. For 2 to 4 weeks to prevent sinus headaches 02/23/17  Yes Shawnee Knapp, MD  metoprolol succinate (TOPROL-XL) 100 MG 24 hr tablet TAKE 1 TABLET (100 MG TOTAL) BY MOUTH DAILY. TAKE WITH OR IMMEDIATELY FOLLOWING A MEAL. 12/15/16  Yes Shawnee Knapp, MD  traMADol (ULTRAM) 50 MG tablet Take 1 tablet (50 mg total) every 6 (six) hours as needed by mouth. 02/23/17  Yes Shawnee Knapp, MD  HYDROcodone-acetaminophen (NORCO) 5-325 MG tablet Take 1 tablet by mouth every 6 (six) hours as needed for moderate pain. 03/19/17   Horald Pollen, MD  olmesartan-hydrochlorothiazide (BENICAR HCT) 40-25 MG tablet Take 1 tablet daily by mouth. Patient not taking: Reported on 03/19/2017 02/23/17   Shawnee Knapp, MD    Allergies  Allergen Reactions  . Aspirin Nausea Only    High dose asp.... He tolerates 81 mg   . Codeine Other (See Comments)    Derivatives, "causes my sinuses to hurt"    Patient Active Problem List   Diagnosis Date Noted  . Class 1 obesity due to excess calories with serious comorbidity and body mass index (BMI) of 32.0 to 32.9 in adult 12/14/2016  . Hyperlipidemia 08/17/2012  . Headache(784.0) 08/17/2012  . Prostate cancer ([place]) 01/08/2012  . HTN (hypertension) 08/01/2011  . Left inguinal hernia  11/20/2010    Past Medical History:  Diagnosis Date  . Abnormal PSA 09/2011   7.63,   . Allergy   . BPH (benign prostatic hyperplasia)   . Elevated PSA   . Hernia   . Hyperlipidemia   . Hypertension   . Inguinal hernia    left   . Prostate CA ([place]) 01/08/12   bx=Adenocarcinoma, gleason 6 (3+3), prostate volume 43 cc  . Prostate cancer [facility]) dx 11/2010   prostate ca,PSA=7.2 T1c volume=30cc  . S/P radiation therapy within four to twelve weeks 08/23/12-10/18/12   prostate,7800cGy/40 sessions  . Urinary obstruction     Past Surgical History:  Procedure Laterality Date  . HERNIA REPAIR     right inguinal done in early 70's  . PROSTATE BIOPSY  01/08/12   Adenocarcinoma,gleason=3+3=6,& 3+4=7,volume=43cc     Social History   Socioeconomic History  . Marital status: Divorced    Spouse name: Not on file  . Number of children: 2  . Years of education: Not on file  . Highest education level: Not on file  Social Needs  . Financial resource strain: Not on file  . Food insecurity - worry: Not on file  . Food insecurity - inability: Not on file  . Transportation needs - medical: Not on file  . Transportation needs - non-medical: Not on file  Occupational History  . Occupation: Retired  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[employer]  Tobacco Use  . Smoking status: Never Smoker  . Smokeless tobacco: Never Used  Substance and Sexual Activity  . Alcohol use: No  . Drug use: Yes    Types: MDMA (Ecstacy)  . Sexual activity: Yes  Other Topics Concern  . Not on file  Social History Narrative  . Not on file    Family History  Problem Relation Age of Onset  . Cancer Sister 67       pancreatic     Review of Systems  Constitutional: Negative.  Negative for chills and fever.  HENT: Positive for sinus pain. Negative for ear pain, nosebleeds and sore throat.   Eyes: Negative for blurred vision and double vision.  Respiratory: Negative for cough  and shortness of breath.   Cardiovascular: Negative for chest pain and palpitations.  Gastrointestinal: Negative for abdominal pain, diarrhea, nausea and vomiting.  Genitourinary: Negative for dysuria and hematuria.  Musculoskeletal: Negative for back pain, myalgias and neck pain.  Skin: Negative.  Negative for rash.  Neurological: Positive for headaches. Negative for dizziness.  Endo/Heme/Allergies: Negative.   All other systems reviewed and are negative.    Vitals:   03/19/17 1046  BP: (!) 203/123  Pulse: 74  Resp: 16  Temp: 98 F (36.7 C)  SpO2: 96%   Repeat BP by me: 160/100  Physical Exam  Constitutional: He is oriented to person, place, and time. He appears well-developed and well-nourished.  HENT:  Head: Normocephalic and atraumatic.  Right Ear: External ear normal.  Left Ear: External ear normal.  Nose: Nose normal.  Mouth/Throat: Oropharynx is clear and moist.  Eyes: EOM are normal. Pupils are equal, round, and reactive to light.  Neck: Normal range of motion. Neck supple. No JVD present.  Cardiovascular: Normal rate, regular rhythm and normal heart sounds.  Pulmonary/Chest: Effort normal and breath sounds normal.  Abdominal: Soft. Bowel sounds are normal. There is no tenderness.  Musculoskeletal: Normal range of motion.  Lymphadenopathy:    He has no cervical adenopathy.  Neurological: He is alert and oriented to person, place, and time. He displays normal reflexes. No cranial nerve deficit or sensory deficit. He exhibits normal muscle tone. Coordination normal.  Skin: Skin is warm and dry. Capillary refill takes less than 2 seconds.  Psychiatric: He has a normal mood and affect. His behavior is normal.     ASSESSMENT & PLAN: Stop Tramadol and Zyrtec. Will give trial of Augmentin to cover possible sinus infection.  Dain was seen today for headache.  Diagnoses and all orders for this visit:  Acute nonintractable headache, unspecified headache  type  Essential hypertension  Sinus pressure -     amoxicillin-clavulanate (AUGMENTIN) 875-125 MG tablet; Take 1 tablet by mouth 2 (two) times daily for 7 days.  Other orders -     HYDROcodone-acetaminophen (NORCO) 5-325 MG tablet; Take 1 tablet by mouth every 6 (six) hours as needed for moderate pain.    Patient Instructions    Stop Cetrizine and Tramadol. Start taking blood pressure medication today. Return if worse.   IF you received an x-ray today, you will receive an invoice from [facility]. Please contact [facility] at [phone] with questions or concerns regarding your invoice.   IF you received labwork today, you will receive an invoice from [place]. Please contact [facility] at [phone] with questions or concerns regarding your invoice.   Our billing staff will not be able to assist you  with questions regarding bills from these companies.  You will be contacted with the lab results as soon as they are available. The fastest way to get your results is to activate your My Chart account. Instructions are located on the last page of this paperwork. If you have not heard from [place] regarding the results in 2 weeks, please contact this office.     General Headache Without Cause A headache is pain or discomfort felt around the head or neck area. There are many causes and types of headaches. In some cases, the cause may not be found. Follow these instructions at home: Managing pain  Take over-the-counter and prescription medicines only as told by your doctor.  Lie down in a dark, quiet room when you have a headache.  If directed, apply ice to the head and neck area: ? Put ice in a plastic bag. ? Place a towel between your skin and the bag. ? Leave the ice on for 20 minutes, 2-3 times per day.  Use a heating pad or hot shower to apply heat to the head and neck area as told by your doctor.  Keep lights dim if bright lights bother you or make your  headaches worse. Eating and drinking  Eat meals on a regular schedule.  Lessen how much alcohol you drink.  Lessen how much caffeine you drink, or stop drinking caffeine. General instructions  Keep all follow-up visits as told by your doctor. This is important.  Keep a journal to find out if certain things bring on headaches. For example, write down: ? What you eat and drink. ? How much sleep you get. ? Any change to your diet or medicines.  Relax by getting a massage or doing other relaxing activities.  Lessen stress.  Sit up straight. Do not tighten (tense) your muscles.  Do not use tobacco products. This includes cigarettes, chewing tobacco, or e-cigarettes. If you need help quitting, ask your doctor.  Exercise regularly as told by your doctor.  Get enough sleep. This often means 7-9 hours of sleep. Contact a doctor if:  Your symptoms are not helped by medicine.  You have a headache that feels different than the other headaches.  You feel sick to your stomach (nauseous) or you throw up (vomit).  You have a fever. Get help right away if:  Your headache becomes really bad.  You keep throwing up.  You have a stiff neck.  You have trouble seeing.  You have trouble speaking.  You have pain in the eye or ear.  Your muscles are weak or you lose muscle control.  You lose your balance or have trouble walking.  You feel like you will pass out (faint) or you pass out.  You have confusion. This information is not intended to replace advice given to you by your health care provider. Make sure you discuss any questions you have with your health care provider. Document Released: 01/15/2008 Document Revised: 09/13/2015 Document Reviewed: 07/31/2014 Elsevier Interactive Patient Education  2018 [employer].  Hypertension Hypertension, commonly called high blood pressure, is when the force of blood pumping through the arteries is too strong. The arteries are the blood  vessels that carry blood from the heart throughout the body. Hypertension forces the heart to work harder to pump blood and may cause arteries to become narrow or stiff. Having untreated or uncontrolled hypertension can cause heart attacks, strokes, kidney disease, and other problems. A blood pressure reading consists of a higher number over  a lower number. Ideally, your blood pressure should be below 120/80. The first ("top") number is called the systolic pressure. It is a measure of the pressure in your arteries as your heart beats. The second ("bottom") number is called the diastolic pressure. It is a measure of the pressure in your arteries as the heart relaxes. What are the causes? The cause of this condition is not known. What increases the risk? Some risk factors for high blood pressure are under your control. Others are not. Factors you can change  Smoking.  Having type 2 diabetes mellitus, high cholesterol, or both.  Not getting enough exercise or physical activity.  Being overweight.  Having too much fat, sugar, calories, or salt (sodium) in your diet.  Drinking too much alcohol. Factors that are difficult or impossible to change  Having chronic kidney disease.  Having a family history of high blood pressure.  Age. Risk increases with age.  Race. You may be at higher risk if you are African-American.  Gender. Men are at higher risk than women before age 62. After age 36, women are at higher risk than men.  Having obstructive sleep apnea.  Stress. What are the signs or symptoms? Extremely high blood pressure (hypertensive crisis) may cause:  Headache.  Anxiety.  Shortness of breath.  Nosebleed.  Nausea and vomiting.  Severe chest pain.  Jerky movements you cannot control (seizures).  How is this diagnosed? This condition is diagnosed by measuring your blood pressure while you are seated, with your arm resting on a surface. The cuff of the blood pressure  monitor will be placed directly against the skin of your upper arm at the level of your heart. It should be measured at least twice using the same arm. Certain conditions can cause a difference in blood pressure between your right and left arms. Certain factors can cause blood pressure readings to be lower or higher than normal (elevated) for a short period of time:  When your blood pressure is higher when you are in a health care provider's office than when you are at home, this is called white coat hypertension. Most people with this condition do not need medicines.  When your blood pressure is higher at home than when you are in a health care provider's office, this is called masked hypertension. Most people with this condition may need medicines to control blood pressure.  If you have a high blood pressure reading during one visit or you have normal blood pressure with other risk factors:  You may be asked to return on a different day to have your blood pressure checked again.  You may be asked to monitor your blood pressure at home for 1 week or longer.  If you are diagnosed with hypertension, you may have other blood or imaging tests to help your health care provider understand your overall risk for other conditions. How is this treated? This condition is treated by making healthy lifestyle changes, such as eating healthy foods, exercising more, and reducing your alcohol intake. Your health care provider may prescribe medicine if lifestyle changes are not enough to get your blood pressure under control, and if:  Your systolic blood pressure is above 130.  Your diastolic blood pressure is above 80.  Your personal target blood pressure may vary depending on your medical conditions, your age, and other factors. Follow these instructions at home: Eating and drinking  Eat a diet that is high in fiber and potassium, and low in sodium, added  sugar, and fat. An example eating plan is called  the DASH (Dietary Approaches to Stop Hypertension) diet. To eat this way: ? Eat plenty of fresh fruits and vegetables. Try to fill half of your plate at each meal with fruits and vegetables. ? Eat whole grains, such as whole wheat pasta, brown rice, or whole grain bread. Fill about one quarter of your plate with whole grains. ? Eat or drink low-fat dairy products, such as skim milk or low-fat yogurt. ? Avoid fatty cuts of meat, processed or cured meats, and poultry with skin. Fill about one quarter of your plate with lean proteins, such as fish, chicken without skin, beans, eggs, and tofu. ? Avoid premade and processed foods. These tend to be higher in sodium, added sugar, and fat.  Reduce your daily sodium intake. Most people with hypertension should eat less than 1,500 mg of sodium a day.  Limit alcohol intake to no more than 1 drink a day for nonpregnant women and 2 drinks a day for men. One drink equals 12 oz of beer, 5 oz of wine, or 1 oz of hard liquor. Lifestyle  Work with your health care provider to maintain a healthy body weight or to lose weight. Ask what an ideal weight is for you.  Get at least 30 minutes of exercise that causes your heart to beat faster (aerobic exercise) most days of the week. Activities may include walking, swimming, or biking.  Include exercise to strengthen your muscles (resistance exercise), such as pilates or lifting weights, as part of your weekly exercise routine. Try to do these types of exercises for 30 minutes at least 3 days a week.  Do not use any products that contain nicotine or tobacco, such as cigarettes and e-cigarettes. If you need help quitting, ask your health care provider.  Monitor your blood pressure at home as told by your health care provider.  Keep all follow-up visits as told by your health care provider. This is important. Medicines  Take over-the-counter and prescription medicines only as told by your health care provider. Follow  directions carefully. Blood pressure medicines must be taken as prescribed.  Do not skip doses of blood pressure medicine. Doing this puts you at risk for problems and can make the medicine less effective.  Ask your health care provider about side effects or reactions to medicines that you should watch for. Contact a health care provider if:  You think you are having a reaction to a medicine you are taking.  You have headaches that keep coming back (recurring).  You feel dizzy.  You have swelling in your ankles.  You have trouble with your vision. Get help right away if:  You develop a severe headache or confusion.  You have unusual weakness or numbness.  You feel faint.  You have severe pain in your chest or abdomen.  You vomit repeatedly.  You have trouble breathing. Summary  Hypertension is when the force of blood pumping through your arteries is too strong. If this condition is not controlled, it may put you at risk for serious complications.  Your personal target blood pressure may vary depending on your medical conditions, your age, and other factors. For most people, a normal blood pressure is less than 120/80.  Hypertension is treated with lifestyle changes, medicines, or a combination of both. Lifestyle changes include weight loss, eating a healthy, low-sodium diet, exercising more, and limiting alcohol. This information is not intended to replace advice given to you by  your health care provider. Make sure you discuss any questions you have with your health care provider. Document Released: 04/07/2005 Document Revised: 03/05/2016 Document Reviewed: 03/05/2016 Elsevier Interactive Patient Education  2018 [employer]      Agustina Caroli, MD [facility]

## 2017-04-25 ENCOUNTER — Ambulatory Visit (INDEPENDENT_AMBULATORY_CARE_PROVIDER_SITE_OTHER): Payer: Medicare Other | Admitting: Family Medicine

## 2017-04-25 ENCOUNTER — Other Ambulatory Visit: Payer: Self-pay

## 2017-04-25 ENCOUNTER — Encounter: Payer: Self-pay | Admitting: Family Medicine

## 2017-04-25 VITALS — BP 138/82 | HR 87 | Temp 97.6°F | Resp 16 | Ht 67.0 in | Wt 211.0 lb

## 2017-04-25 DIAGNOSIS — Z5181 Encounter for therapeutic drug level monitoring: Secondary | ICD-10-CM | POA: Diagnosis not present

## 2017-04-25 DIAGNOSIS — J3489 Other specified disorders of nose and nasal sinuses: Secondary | ICD-10-CM | POA: Diagnosis not present

## 2017-04-25 DIAGNOSIS — R519 Headache, unspecified: Secondary | ICD-10-CM

## 2017-04-25 DIAGNOSIS — R51 Headache: Secondary | ICD-10-CM | POA: Diagnosis not present

## 2017-04-25 MED ORDER — GUAIFENESIN ER 600 MG PO TB12: 600.0000 mg | ORAL_TABLET | Freq: Two times a day (BID) | ORAL | 0 refills | Status: DC

## 2017-04-25 MED ORDER — TRAMADOL HCL 50 MG PO TABS: 100.0000 mg | ORAL_TABLET | Freq: Three times a day (TID) | ORAL | 1 refills | Status: DC | PRN

## 2017-04-25 MED ORDER — ASPIRIN EC 81 MG PO TBEC: 81.0000 mg | DELAYED_RELEASE_TABLET | Freq: Every day | ORAL | Status: AC

## 2017-04-25 MED ORDER — MOMETASONE FUROATE 50 MCG/ACT NA SUSP: 2.0000 | Freq: Every day | NASAL | 2 refills | Status: DC

## 2017-04-25 NOTE — Progress Notes (Signed)
Subjective:  By signing my name below, I, Essence Howell, attest that this documentation has been prepared under the direction and in the presence of Delman Cheadle, MD Electronically Signed: Ladene Artist, ED Scribe 04/25/2017 at 8:10 AM.   Patient ID: Matthew Wilkerson, male    DOB: July 03, 1945, 72 y.o.   MRN: [id number]  Chief Complaint  Patient presents with  . Headache    patient states that he has been having headaches with medication, unsure which med is causing the issue   HPI Matthew Wilkerson is a 72 y.o. male who presents to Primary Care at [facility] complaining of intermittent HAs upon waking x 4 months. Pt reports HAs over L eyebrow and between the bridge of nose, which usually occurs after sneezing. He was seen by Dr. Mitchel Honour on 11/29 and treated with amoxicillin 875-125 mg for HAs and sinus pressure, however, pt states that he did not finish the antibiotics. He was initially seen by Dr. Everlene Farrier for HAs who prescribed Tramadol; pt states Tramadol used to work well but now takes a while to resolve HA. Pt stopped Tramadol and started 2 Excedrin Extra Strength at least 3 days/week, which he states works quicker. He does not use nasal sprays; states it also caused increased sinus pressure. Pt denies leg swelling at this time but suspects that his HAs increased in frequency after switching BP meds.  PSA PSA was 0.68 on 12/6.  Past Medical History:  Diagnosis Date  . Abnormal PSA 09/2011   7.63,   . Allergy   . BPH (benign prostatic hyperplasia)   . Elevated PSA   . Hernia   . Hyperlipidemia   . Hypertension   . Inguinal hernia    left   . Prostate CA ([place]) 01/08/12   bx=Adenocarcinoma, gleason 6 (3+3), prostate volume 43 cc  . Prostate cancer [facility]) dx 11/2010   prostate ca,PSA=7.2 T1c volume=30cc  . S/P radiation therapy within four to twelve weeks 08/23/12-10/18/12   prostate,7800cGy/40 sessions  . Urinary obstruction    Current Outpatient Medications on File Prior to Visit  Medication  Sig Dispense Refill  . atorvastatin (LIPITOR) 20 MG tablet Take 1 tablet (20 mg total) by mouth daily. 90 tablet 3  . metoprolol succinate (TOPROL-XL) 100 MG 24 hr tablet TAKE 1 TABLET (100 MG TOTAL) BY MOUTH DAILY. TAKE WITH OR IMMEDIATELY FOLLOWING A MEAL. 90 tablet 3  . olmesartan-hydrochlorothiazide (BENICAR HCT) 40-25 MG tablet Take 1 tablet daily by mouth. 90 tablet 0  . traMADol (ULTRAM) 50 MG tablet Take 1 tablet (50 mg total) every 6 (six) hours as needed by mouth. 20 tablet 0  . cetirizine (ZYRTEC) 10 MG tablet Take 1 tablet (10 mg total) at bedtime by mouth. For 2 to 4 weeks to prevent sinus headaches (Patient not taking: Reported on 04/25/2017) 30 tablet 1  . HYDROcodone-acetaminophen (NORCO) 5-325 MG tablet Take 1 tablet by mouth every 6 (six) hours as needed for moderate pain. (Patient not taking: Reported on 04/25/2017) 15 tablet 0   No current facility-administered medications on file prior to visit.    Allergies  Allergen Reactions  . Aspirin Nausea Only    High dose asp.... He tolerates 81 mg   . Codeine Other (See Comments)    Derivatives, "causes my sinuses to hurt"   Past Surgical History:  Procedure Laterality Date  . HERNIA REPAIR     right inguinal done in early 70's  . PROSTATE BIOPSY  01/08/12   Adenocarcinoma,gleason=3+3=6,& 3+4=7,volume=43cc  Family History  Problem Relation Age of Onset  . Cancer Sister 8       pancreatic   Social History   Socioeconomic History  . Marital status: Divorced    Spouse name: None  . Number of children: 2  . Years of education: None  . Highest education level: None  Social Needs  . Financial resource strain: None  . Food insecurity - worry: None  . Food insecurity - inability: None  . Transportation needs - medical: None  . Transportation needs - non-medical: None  Occupational History  . 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[employer]  Tobacco Use  .  Smoking status: Never Smoker  . Smokeless tobacco: Never Used  Substance and Sexual Activity  . Alcohol use: No  . Drug use: Yes    Types: MDMA (Ecstacy)  . Sexual activity: Yes  Other Topics Concern  . None  Social History Narrative  . None   Depression screen [facility] 2/9 04/25/2017 03/19/2017 01/12/2017 12/29/2016 12/15/2016  Decreased Interest 0 0 0 0 0  Down, Depressed, Hopeless 0 0 0 0 0  PHQ - 2 Score 0 0 0 0 0  Altered sleeping - - - - -  Tired, decreased energy - - - - -  Change in appetite - - - - -  Feeling bad or failure about yourself  - - - - -  Trouble concentrating - - - - -  Moving slowly or fidgety/restless - - - - -  Suicidal thoughts - - - - -  PHQ-9 Score - - - - -    Review of Systems  HENT: Positive for postnasal drip and sneezing.   Cardiovascular: Negative for leg swelling.  Neurological: Positive for headaches.      Objective:   Physical Exam  Constitutional: He is oriented to person, place, and time. He appears well-developed and well-nourished. No distress.  HENT:  Head: Normocephalic and atraumatic.  Right Ear: Tympanic membrane normal.  Left Ear: Tympanic membrane normal.  Nose: Mucosal edema and rhinorrhea (mild) present.  Mouth/Throat: Oropharynx is clear and moist.  Eyes: Conjunctivae and EOM are normal.  Neck: Neck supple. No tracheal deviation present.  Cardiovascular: Normal rate.  Pulmonary/Chest: Effort normal. No respiratory distress.  Musculoskeletal: Normal range of motion.  Neurological: He is alert and oriented to person, place, and time.  Skin: Skin is warm and dry.  Psychiatric: He has a normal mood and affect. His behavior is normal.  Nursing note and vitals reviewed.  BP 138/82 (BP Location: Left Arm, Patient Position: Sitting, Cuff Size: Normal)   Pulse 87   Temp 97.6 F (36.4 C) (Oral)   Resp 16   Ht 5\' 7"  (1.702 m)   Wt 211 lb (95.7 kg)   SpO2 98%   BMI 33.05 kg/m     Assessment & Plan:   1. Chronic nonintractable  headache, unspecified headache type - again asks for tramadol which Dr. Everlene Farrier rx'd him prev and worked but reminded pt that we DID rx him tramadol sev mos ago and he stopped using as found the excedrin migraine to be more effective - however, concerns about long-term side effects if use increases at all and pt noting bp slightly increasing. Advised next steps could be sinus imaging xr/ct and/or ent referral but pt declines for now. Hydrocodone to strong  2. Medication monitoring encounter   3. Sinus pressure - failed zyrtec, did not complete full 7d  of augmentin he was rx'd as didn't like taking so much med. Doesn't want anything to strong. very resistant to nasal spray due to increased pain with an otc product but willing to try rx nasal steroid - nasonex since better tolerated on nasal mucosa than flonase. Start mucinex.    Did start having the HA problems again after we stopped his amlodipine and started him on lisinopril - no longer on the latter and does not want to resume amlodipine due to resolution of long-standing edema.   Orders Placed This Encounter  Procedures  . CBC with Differential/Platelet  . Comprehensive metabolic panel  . Sedimentation Rate    Meds ordered this encounter  Medications  . traMADol (ULTRAM) 50 MG tablet    Sig: Take 2 tablets (100 mg total) by mouth every 8 (eight) hours as needed.    Dispense:  40 tablet    Refill:  1  . aspirin EC 81 MG tablet    Sig: Take 1 tablet (81 mg total) by mouth daily.  . mometasone (NASONEX) 50 MCG/ACT nasal spray    Sig: Place 2 sprays into the nose daily.    Dispense:  17 g    Refill:  2  . guaiFENesin (MUCINEX) 600 MG 12 hr tablet    Sig: Take 1 tablet (600 mg total) by mouth 2 (two) times daily.    Dispense:  56 tablet    Refill:  0    I personally performed the services described in this documentation, which was scribed in my presence. The recorded information has been reviewed and considered, and addended by me as  needed.   Delman Cheadle, M.D.  Primary Care at [facility] [address] [place], [place] [address] [phone] phone [phone] fax  04/25/17 4:29 PM

## 2017-04-25 NOTE — Patient Instructions (Addendum)
Use the mometasone nasal spray every night before bed for the next month.  If it is making your headaches worse, you can stop it.  Spray the nasal spray towards your ears by using the opposite hand of the nare you are treating and tilting the bottle - 2 sprays on each side. Try to avoid spraying it straight back into your head. Remember to "look at your toes, and point up your nose"  Use the mucinex twice a day for at least 2 weeks.  When you have a headache try taking 2 tramadol. If this isn't working or if it is causing any side effects from the tramadol, change to taking 1 tramadol and 1 excedrin migraine together instead.  Hot showers or breathing in steam may help loosen the congestion.  Using a netti pot or sinus rinse is also likely to help you feel better and keep this from progressing.      IF you received an x-ray today, you will receive an invoice from [facility]. Please contact [facility] at [phone] with questions or concerns regarding your invoice.   IF you received labwork today, you will receive an invoice from [facility]. Please contact [facility] at [phone] with questions or concerns regarding your invoice.   Our billing staff will not be able to assist you with questions regarding bills from these companies.  You will be contacted with the lab results as soon as they are available. The fastest way to get your results is to activate your My Chart account. Instructions are located on the last page of this paperwork. If you have not heard from [place] regarding the results in 2 weeks, please contact this office.

## 2017-04-26 LAB — COMPREHENSIVE METABOLIC PANEL
ALT: 28 IU/L (ref 0–44)
AST: 32 IU/L (ref 0–40)
Albumin/Globulin Ratio: 1.5 (ref 1.2–2.2)
Albumin: 4.5 g/dL (ref 3.5–4.8)
Alkaline Phosphatase: 88 IU/L (ref 39–117)
BUN/Creatinine Ratio: 17 (ref 10–24)
BUN: 24 mg/dL (ref 8–27)
Bilirubin Total: 0.4 mg/dL (ref 0.0–1.2)
CALCIUM: 9.5 mg/dL (ref 8.6–10.2)
CHLORIDE: 106 mmol/L (ref 96–106)
CO2: 19 mmol/L — AB (ref 20–29)
CREATININE: 1.43 mg/dL — AB (ref 0.76–1.27)
GFR, EST AFRICAN AMERICAN: 56 mL/min/{1.73_m2} — AB (ref >59)
GFR, EST NON AFRICAN AMERICAN: 48 mL/min/{1.73_m2} — AB (ref >59)
GLUCOSE: 104 mg/dL — AB (ref 65–99)
Globulin, Total: 3 g/dL (ref 1.5–4.5)
Potassium: 4 mmol/L (ref 3.5–5.2)
Sodium: 143 mmol/L (ref 134–144)
TOTAL PROTEIN: 7.5 g/dL (ref 6.0–8.5)

## 2017-04-26 LAB — CBC WITH DIFFERENTIAL/PLATELET
BASOS ABS: 0 10*3/uL (ref 0.0–0.2)
BASOS: 0 %
EOS (ABSOLUTE): 0.2 10*3/uL (ref 0.0–0.4)
Eos: 2 %
Hematocrit: 42.3 % (ref 37.5–51.0)
Hemoglobin: 14 g/dL (ref 13.0–17.7)
IMMATURE GRANS (ABS): 0 10*3/uL (ref 0.0–0.1)
IMMATURE GRANULOCYTES: 0 %
LYMPHS: 22 %
Lymphocytes Absolute: 1.5 10*3/uL (ref 0.7–3.1)
MCH: 31.4 pg (ref 26.6–33.0)
MCHC: 33.1 g/dL (ref 31.5–35.7)
MCV: 95 fL (ref 79–97)
Monocytes Absolute: 0.7 10*3/uL (ref 0.1–0.9)
Monocytes: 11 %
NEUTROS PCT: 65 %
Neutrophils Absolute: 4.3 10*3/uL (ref 1.4–7.0)
PLATELETS: 181 10*3/uL (ref 150–379)
RBC: 4.46 x10E6/uL (ref 4.14–5.80)
RDW: 14.6 % (ref 12.3–15.4)
WBC: 6.8 10*3/uL (ref 3.4–10.8)

## 2017-04-26 LAB — SEDIMENTATION RATE: Sed Rate: 40 mm/hr — ABNORMAL HIGH (ref 0–30)

## 2017-05-21 ENCOUNTER — Other Ambulatory Visit: Payer: Self-pay

## 2017-05-21 ENCOUNTER — Telehealth: Payer: Self-pay | Admitting: Family Medicine

## 2017-05-21 DIAGNOSIS — R519 Headache, unspecified: Secondary | ICD-10-CM

## 2017-05-21 DIAGNOSIS — R51 Headache: Principal | ICD-10-CM

## 2017-05-21 NOTE — Progress Notes (Unsigned)
Discussed with Dr. Brigitte Pulse,  Order placed for Neurology referral for headaches.  Please advise pt of progress.

## 2017-05-21 NOTE — Telephone Encounter (Signed)
Order placed for referral to Neurology

## 2017-05-21 NOTE — Telephone Encounter (Signed)
Copied from Rachel. Topic: General - Other >> May 21, 2017  8:08 AM Lolita Rieger, RMA wrote: Reason for CRM: pt would like to be referred to a headache specialist because the medication that has been prescribed is not controlling his headaches Pt stated that if he can not be reached by phone then to please mail correspondence to his home address

## 2017-06-13 ENCOUNTER — Other Ambulatory Visit: Payer: Self-pay | Admitting: Family Medicine

## 2017-07-22 ENCOUNTER — Encounter: Payer: Self-pay | Admitting: Physician Assistant

## 2017-09-15 ENCOUNTER — Other Ambulatory Visit: Payer: Self-pay | Admitting: Family Medicine

## 2017-11-17 DIAGNOSIS — C61 Malignant neoplasm of prostate: Secondary | ICD-10-CM | POA: Diagnosis not present

## 2017-12-17 ENCOUNTER — Encounter: Payer: Medicare Other | Admitting: Family Medicine

## 2017-12-17 ENCOUNTER — Ambulatory Visit: Payer: Medicare Other

## 2017-12-17 NOTE — Progress Notes (Deleted)
   Subjective:    Patient ID: Matthew Wilkerson, male    DOB: 03/29/1946, 72 y.o.   MRN: [id number]  HPI  Matthew Wilkerson is a delightful 71 yo male with a PMHx sig for HTN for which I last saw him 10 mos ago.  HTN: 10 mos prior olmesartan-hctz was increased to 40-25 (from 40-12.5 and cmp 2 mos later was unchanged from baseline. Had severe chronic pedal edema on amlodipine so does not want to retry.  CKDIII: Renal fxn very slowly progressing over the past 5 years Lab Results  Component Value Date   CREATININE 1.43 (H) 04/25/2017   CREATININE 1.40 (H) 02/23/2017   CREATININE 1.37 (H) 12/29/2016   CREATININE 1.24 10/24/2016   CREATININE 1.36 (H) 11/09/2015   Lab Results  Component Value Date   GFRAA 56 (L) 04/25/2017   GFRAA 57 (L) 02/23/2017   GFRAA 59 (L) 12/29/2016   GFRAA 67 10/24/2016   GFRAA 60 11/09/2015     Chronic recurrent Left orbital HA: I did also see him 8 mos ago for 4 mos of chronic HAs above/around left eye.  He had had a similar episode years prior that resolved after he was rx'd tramadol and he had already failed compliance with trx for sinus infxn as cause as did not finish augmentin course, failed zyrtec. He was thought HA was due to BP med as it recurred shortly after I had started him on lisinopril in leiu of amlodipine but d/c lisinopril did not result in any sxs improvement and pt declined to be restarted on the amlodipine as it had been causing sig bothersome chronic pedal edema that had finally resolved off the amlodipine. However, when we retried tramadol last yr for the HAs, he found it to be less effective than excedrin migraine which he was using instead and hydrocodone to strong for him to tolerate. Pt declined further eval of his left orbital facial HA w/ sinus imaging and/or ENT eval for poss chronic sinusitis as etiology. Was VERY reluctant to try any nasal spray as prior use of otc product increased his pain but did agree to do 2 wk trial of nasonex and start  mucinex.  Unfortunately, this also proved ineffective so pt was referred to [facility] for further eval but they were unsuccessful in sev attempts to contact pt so he was never sched/seen.    Review of Systems     Objective:   Physical Exam        Assessment & Plan:  Stop otc nsaids Consider rechecking esr? Long overdue for lipid panel, psa likely followed by Matthew Wilkerson 

## 2017-12-19 ENCOUNTER — Other Ambulatory Visit: Payer: Self-pay | Admitting: Family Medicine

## 2018-01-14 ENCOUNTER — Other Ambulatory Visit: Payer: Self-pay | Admitting: Family Medicine

## 2018-01-19 ENCOUNTER — Other Ambulatory Visit: Payer: Self-pay

## 2018-01-19 NOTE — Patient Outreach (Signed)
[facility]) Care Management  01/19/2018  Matthew Wilkerson 07-14-1945 [id number]   Medication Adherence call to [name] patient did not answer voice message not set up patient is due on Olmesartan / Hctz 40/25 mg. [name] is showing past due under [place].  [place] Management Direct Dial [phone]  Fax [phone] [email or web address]

## 2018-01-25 ENCOUNTER — Other Ambulatory Visit: Payer: Self-pay | Admitting: Family Medicine

## 2018-01-26 NOTE — Telephone Encounter (Signed)
Attempted to contact pt regarding prescription refill request; last office visit 04/25/17; no upcoming visits noted; unable to leave message on [phone]; pre-recorded message states voicemail is full. Requested Prescriptions  Pending Prescriptions Disp Refills  . olmesartan-hydrochlorothiazide (BENICAR HCT) 40-25 MG tablet [Pharmacy Med Name: OLMESARTAN-HCTZ 40-25 MG TAB] 90 tablet 0    Sig: TAKE 1 TABLET BY MOUTH EVERY DAY     Cardiovascular: ARB + Diuretic Combos Failed - 01/25/2018 10:23 AM      Failed - K in normal range and within 180 days    Potassium  Date Value Ref Range Status  04/25/2017 4.0 3.5 - 5.2 mmol/L Final         Failed - Na in normal range and within 180 days    Sodium  Date Value Ref Range Status  04/25/2017 143 134 - 144 mmol/L Final         Failed - Cr in normal range and within 180 days    Creat  Date Value Ref Range Status  11/09/2015 1.36 (H) 0.70 - 1.18 mg/dL Final    Comment:      For patients > or = 74 years of age: The upper reference limit for Creatinine is approximately 13% higher for people identified as African-American.      Creatinine, Ser  Date Value Ref Range Status  04/25/2017 1.43 (H) 0.76 - 1.27 mg/dL Final         Failed - Ca in normal range and within 180 days    Calcium  Date Value Ref Range Status  04/25/2017 9.5 8.6 - 10.2 mg/dL Final         Failed - Valid encounter within last 6 months    Recent Outpatient Visits          9 months ago Chronic nonintractable headache, unspecified headache type   Primary Care at [facility], Laurey Arrow, MD   10 months ago Acute nonintractable headache, unspecified headache type   Primary Care at [facility], Ines Bloomer, MD   11 months ago Essential hypertension   Primary Care at [facility], Laurey Arrow, MD   1 year ago Essential hypertension   Primary Care at [facility], Laurey Arrow, MD   1 year ago Right-sided headache   [facility] at [facility], Ranell Patrick, MD             Passed -  Patient is not pregnant      Passed - Last BP in normal range    BP Readings from Last 1 Encounters:  04/25/17 138/82

## 2018-01-26 NOTE — Telephone Encounter (Signed)
Attempted to contact pt regarding refill request; last office visit 04/25/17; no upcoming visits noted; no answer at [phone].

## 2018-02-15 ENCOUNTER — Other Ambulatory Visit: Payer: Self-pay

## 2018-02-15 ENCOUNTER — Encounter (HOSPITAL_COMMUNITY): Payer: Self-pay

## 2018-02-15 ENCOUNTER — Ambulatory Visit (HOSPITAL_COMMUNITY)
Admission: EM | Admit: 2018-02-15 | Discharge: 2018-02-15 | Disposition: A | Discharge status: Home / Self Care | Payer: Medicare Other | Attending: Family Medicine | Admitting: Family Medicine

## 2018-02-15 DIAGNOSIS — R040 Epistaxis: Secondary | ICD-10-CM | POA: Diagnosis not present

## 2018-02-15 NOTE — ED Triage Notes (Signed)
Pt c/o nose bleed off and on x 2 days. Headaches as well.

## 2018-02-15 NOTE — Discharge Instructions (Addendum)
I want you to pick up some over-the-counter nasal spray called Afrin (oxymetazoline) and use it twice a day for the next 2 days.  This will cause the blood vessels in the nose to clamp down and see a lot.

## 2018-02-15 NOTE — ED Provider Notes (Signed)
[place]    CSN: [id number] Arrival date & time: 02/15/18  1430     History   Chief Complaint Chief Complaint  Patient presents with  . Epistaxis    HPI Matthew Wilkerson is a 72 y.o. male.   This is a first Matthew Wilkerson urgent care visit for this 72 year old man who has 2 days of intermittent epistaxis.  This is associated with some headaches.  Patient has a history of hypertension and is taking his medicine.     Past Medical History:  Diagnosis Date  . Abnormal PSA 09/2011   7.63,   . Allergy   . BPH (benign prostatic hyperplasia)   . Elevated PSA   . Hernia   . Hyperlipidemia   . Hypertension   . Inguinal hernia    left   . 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 Prostate cancer [facility]) dx 11/2010   prostate ca,PSA=7.2 T1c volume=30cc  . S/P radiation therapy within four to twelve weeks 08/23/12-10/18/12   prostate,7800cGy/40 sessions  . Urinary obstruction     Patient Active Problem List   Diagnosis Date Noted  . Acute nonintractable headache 03/19/2017  . Sinus pressure 03/19/2017  . Class 1 obesity due to excess calories with serious comorbidity and body mass index (BMI) of 32.0 to 32.9 in adult 12/14/2016  . Hyperlipidemia 08/17/2012  . Headache 08/17/2012  . Prostate cancer ([place]) 01/08/2012  . HTN (hypertension) 08/01/2011  . Left inguinal hernia 11/20/2010    Past Surgical History:  Procedure Laterality Date  . HERNIA REPAIR     right inguinal done in early 70's  . PROSTATE BIOPSY  01/08/12   Adenocarcinoma,gleason=3+3=6,& 3+4=7,volume=43cc        Home Medications    Prior to Admission medications   Medication Sig Start Date End Date Taking? Authorizing Provider  aspirin EC 81 MG tablet Take 1 tablet (81 mg total) by mouth daily. 04/25/17   Shawnee Knapp, MD  atorvastatin (LIPITOR) 20 MG tablet TAKE 1 TABLET BY MOUTH EVERY DAY 01/14/18   Shawnee Knapp, MD  guaiFENesin (MUCINEX) 600 MG  12 hr tablet Take 1 tablet (600 mg total) by mouth 2 (two) times daily. 04/25/17   Shawnee Knapp, MD  metoprolol succinate (TOPROL-XL) 100 MG 24 hr tablet TAKE 1 TABLET (100 MG TOTAL) BY MOUTH DAILY. TAKE WITH OR IMMEDIATELY FOLLOWING A MEAL. 01/14/18   Shawnee Knapp, MD  mometasone (NASONEX) 50 MCG/ACT nasal spray Place 2 sprays into the nose daily. 04/25/17   Shawnee Knapp, MD  olmesartan-hydrochlorothiazide (BENICAR HCT) 40-25 MG tablet TAKE 1 TABLET BY MOUTH EVERY DAY 01/26/18   Horald Pollen, MD  traMADol (ULTRAM) 50 MG tablet Take 2 tablets (100 mg total) by mouth every 8 (eight) hours as needed. Patient not taking: Reported on 02/15/2018 04/25/17   Shawnee Knapp, MD    Family History Family History  Problem Relation Age of Onset  . Cancer Sister 83       pancreatic    Social History Social History   Tobacco Use  . Smoking status: Never Smoker  . Smokeless tobacco: Never Used  Substance Use Topics  . Alcohol use: No  . Drug use: Yes    Types: MDMA (Ecstacy)     Allergies   Aspirin and Codeine   Review of Systems Review of Systems   Physical Exam Triage Vital Signs ED Triage Vitals  Enc Vitals  Group     BP 02/15/18 1455 (!) 151/90     Pulse Rate 02/15/18 1455 89     Resp 02/15/18 1455 18     Temp 02/15/18 1455 97.8 F (36.6 C)     Temp Source 02/15/18 1455 Oral     SpO2 02/15/18 1455 98 %     Weight 02/15/18 1456 189 lb (85.7 kg)     Height --      Head Circumference --      Peak Flow --      Pain Score --      Pain Loc --      Pain Edu? --      Excl. in [place]? --    No data found.  Updated Vital Signs BP (!) 151/90 (BP Location: Right Arm)   Pulse 89   Temp 97.8 F (36.6 C) (Oral)   Resp 18   Wt 85.7 kg   SpO2 98%   BMI 29.60 kg/m   Physical Exam  Constitutional: He is oriented to person, place, and time. He appears well-developed and well-nourished.  HENT:  Head: Normocephalic and atraumatic.  Nose: Nose normal.  Mouth/Throat: Oropharynx is clear  and moist.  No blood in nasal passages  Eyes: Conjunctivae are normal.  Neck: Normal range of motion. Neck supple.  Pulmonary/Chest: Effort normal.  Musculoskeletal: Normal range of motion.  Neurological: He is alert and oriented to person, place, and time.  Skin: Skin is warm and dry.  Nursing note and vitals reviewed.    UC Treatments / Results  Labs (all labs ordered are listed, but only abnormal results are displayed) Labs Reviewed - No data to display  EKG None  Radiology No results found.  Procedures Procedures (including critical care time)  Medications Ordered in UC Medications - No data to display  Initial Impression / Assessment and Plan / UC Course  I have reviewed the triage vital signs and the nursing notes.  Pertinent labs & imaging results that were available during my care of the patient were reviewed by me and considered in my medical decision making (see chart for details).    Final Clinical Impressions(s) / UC Diagnoses   Final diagnoses:  Epistaxis     Discharge Instructions     I want you to pick up some over-the-counter nasal spray called Afrin (oxymetazoline) and use it twice a day for the next 2 days.  This will cause the blood vessels in the nose to clamp down and see a lot.    ED Prescriptions    None     Controlled Substance Prescriptions [place] Controlled Substance Registry consulted? Not Applicable   Robyn Haber, MD 02/15/18 1510

## 2018-02-16 ENCOUNTER — Other Ambulatory Visit: Payer: Self-pay | Admitting: Family Medicine

## 2018-02-16 NOTE — Telephone Encounter (Signed)
Attempted to call pt on cell hone and home phone number but no answer at this time and unable to leave voicemail. Pt will need to be scheduled for follow up visit. Last visit on 04/25/17. No future appt scheduled. Last lipid panel was drawn on 01/18/15.

## 2018-02-16 NOTE — Telephone Encounter (Signed)
Requested medication (s) are due for refill today: yes  Requested medication (s) are on the active medication list: yes  Last refill:  01/14/18- Atorvatatin; 01/18/18-Metoprolol Succ  Future visit scheduled: No, attempted to call pt to schedule appt but no answer  Notes to clinic:  30 day courtesy refills given previously. Last lipid panel was drawn on 01/18/15    Requested Prescriptions  Pending Prescriptions Disp Refills   metoprolol succinate (TOPROL-XL) 100 MG 24 hr tablet [Pharmacy Med Name: METOPROLOL SUCC ER 100 MG TAB] 30 tablet 0    Sig: TAKE 1 TABLET (100 MG TOTAL) BY MOUTH DAILY. TAKE WITH OR IMMEDIATELY FOLLOWING A MEAL.     Cardiovascular:  Beta Blockers Failed - 02/16/2018 12:58 PM      Failed - Last BP in normal range    BP Readings from Last 1 Encounters:  02/15/18 (!) 151/90         Failed - Valid encounter within last 6 months    Recent Outpatient Visits          9 months ago Chronic nonintractable headache, unspecified headache type   [facility], Laurey Arrow, MD   11 months ago Acute nonintractable headache, unspecified headache type   Primary Care at [facility], [place], [place]   11 months ago Essential hypertension   [facility], Laurey Arrow, MD   1 year ago Essential hypertension   [facility], Laurey Arrow, MD   1 year ago Right-sided headache   Primary Care at [place], MD             Passed - Last Heart Rate in normal range    Pulse Readings from Last 1 Encounters:  02/15/18 89        atorvastatin (LIPITOR) 20 MG tablet [Pharmacy Med Name: ATORVASTATIN 20 MG TABLET] 30 tablet 0    Sig: TAKE 1 TABLET BY MOUTH EVERY DAY     Cardiovascular:  Antilipid - Statins Failed - 02/16/2018 12:58 PM      Failed - Total Cholesterol in normal range and within 360 days    Cholesterol  Date Value Ref Range Status  01/18/2015 202 (H) 125 - 200 mg/dL Final         Failed - LDL in normal range and within  360 days    LDL Cholesterol  Date Value Ref Range Status  01/18/2015 135 (H) <130 mg/dL Final    Comment:      Total Cholesterol/HDL Ratio:CHD Risk                        Coronary Heart Disease Risk Table                                        Men       Women          1/2 Average Risk              3.4        3.3              Average Risk              5.0        4.4           2X Average Risk  9.6        7.1           3X Average Risk             23.4       11.0 Use the calculated Patient Ratio above and the CHD Risk table  to determine the patient's CHD Risk.          Failed - HDL in normal range and within 360 days    HDL  Date Value Ref Range Status  01/18/2015 38 (L) >=40 mg/dL Final         Failed - Triglycerides in normal range and within 360 days    Triglycerides  Date Value Ref Range Status  01/18/2015 143 <150 mg/dL Final         Passed - Patient is not pregnant      Passed - Valid encounter within last 12 months    Recent Outpatient Visits          9 months ago Chronic nonintractable headache, unspecified headache type   [facility], Laurey Arrow, MD   11 months ago Acute nonintractable headache, unspecified headache type   Primary Care at [facility], Ines Bloomer, MD   11 months ago Essential hypertension   [facility], Laurey Arrow, MD   1 year ago Essential hypertension   [facility], Laurey Arrow, MD   1 year ago Right-sided headache   [facility] at [facility], Ranell Patrick, MD

## 2018-02-22 IMAGING — DX DG HAND COMPLETE 3+V*L*
3 series · 3 of 3 positions shown · non-contrast
Comparison: None.

CLINICAL DATA: Swelling of left pain.

EXAM:
LEFT HAND - COMPLETE 3+ VIEW

[hand pa]
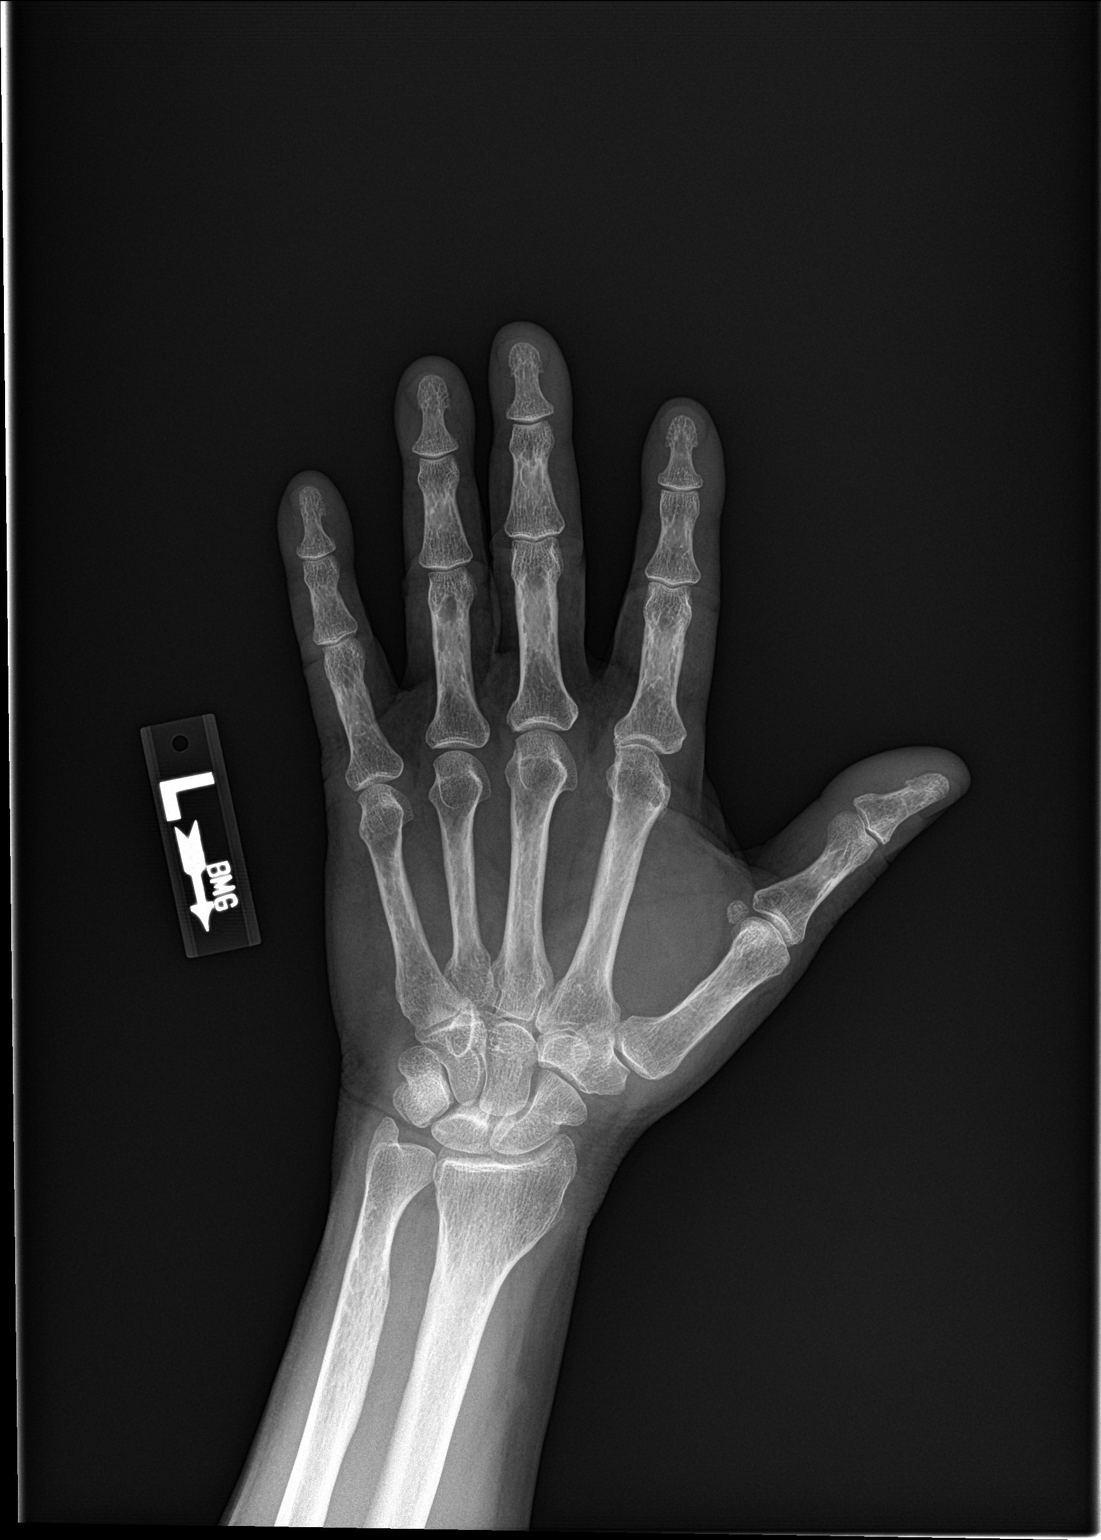

[hand obl]
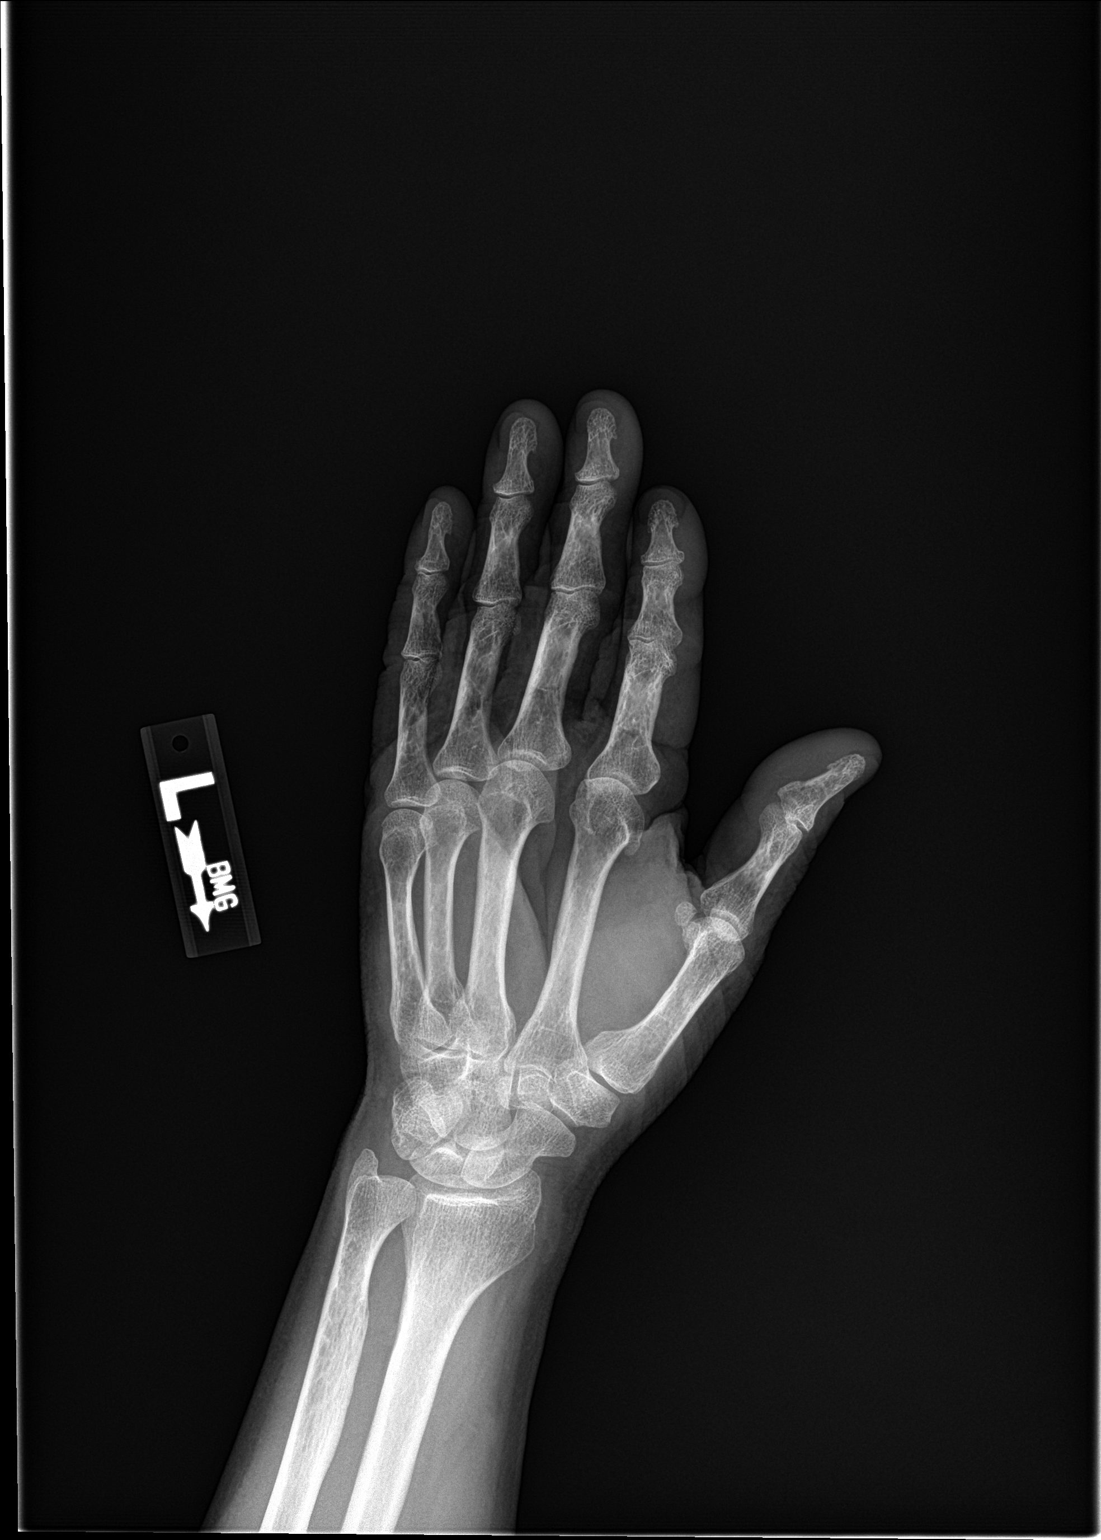

[hand lat]
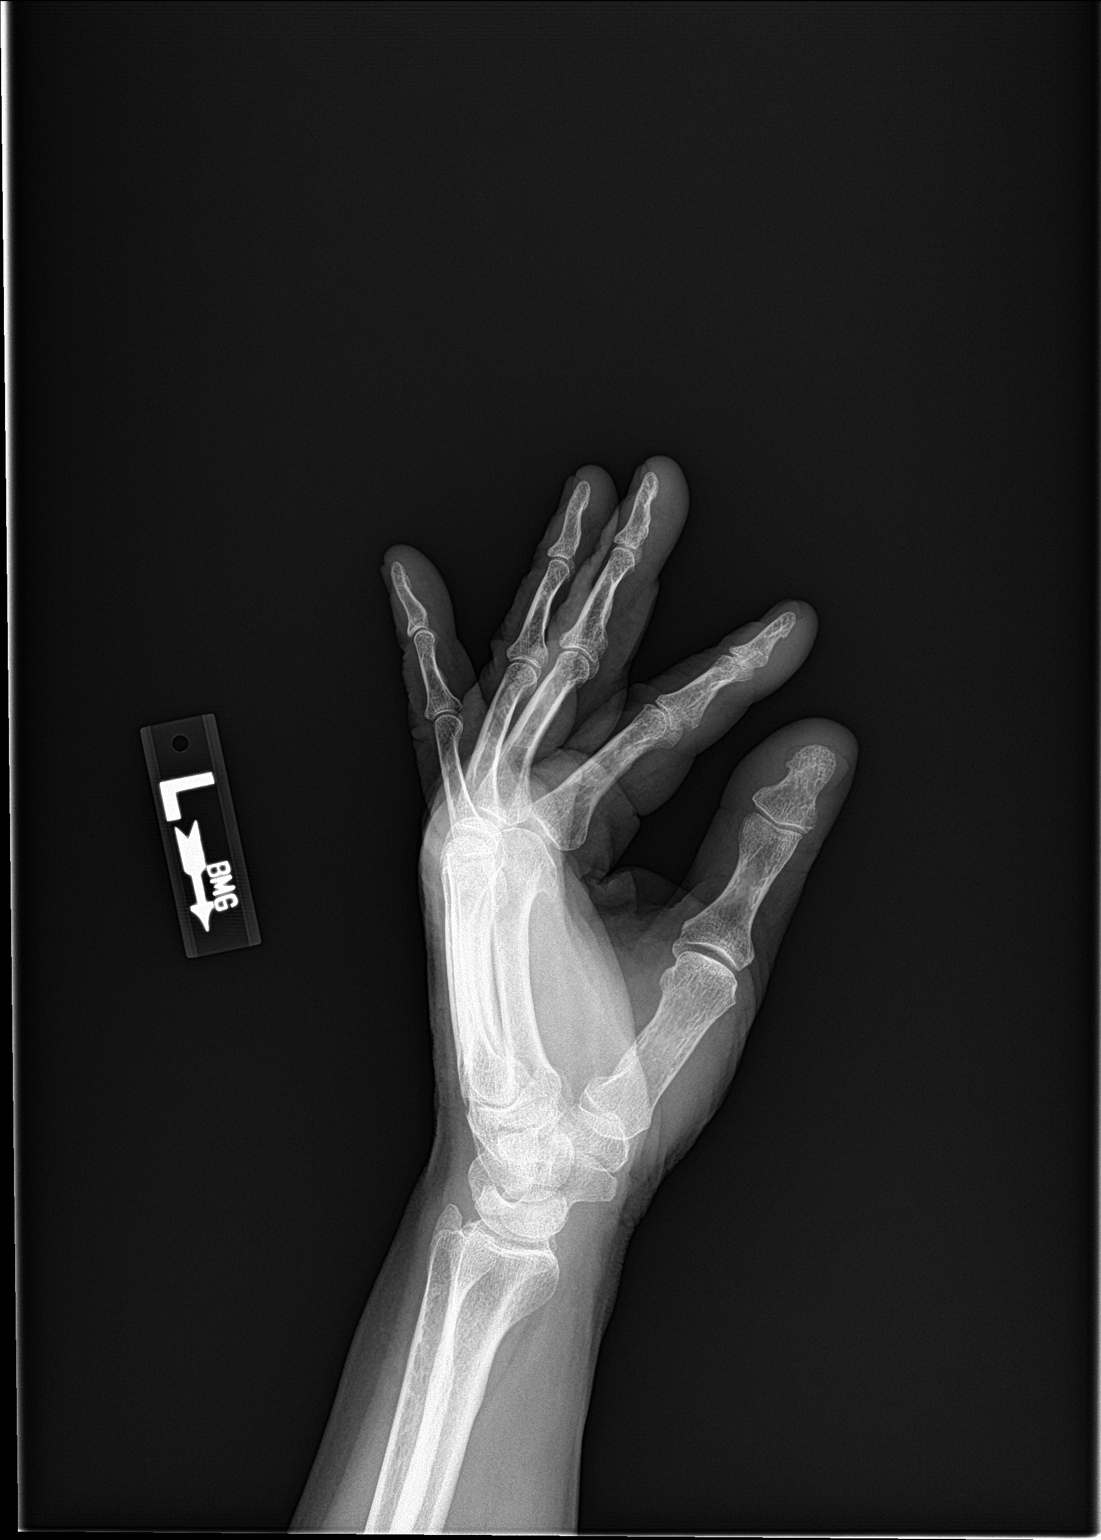

[3 of 3 positions shown; findings below may reference images not displayed]

FINDINGS: There is no evidence of fracture or dislocation. There is no
evidence of significant arthropathy or other focal bone abnormality.
Soft tissues are unremarkable.
IMPRESSION: Negative.

## 2018-02-22 IMAGING — DX DG LUMBAR SPINE COMPLETE 4+V
5 series · 5 of 5 positions shown · non-contrast
Comparison: None.

CLINICAL DATA: Acute bilateral low back pain without sciatica

EXAM:
LUMBAR SPINE - COMPLETE 4+ VIEW

[l-spine ap]
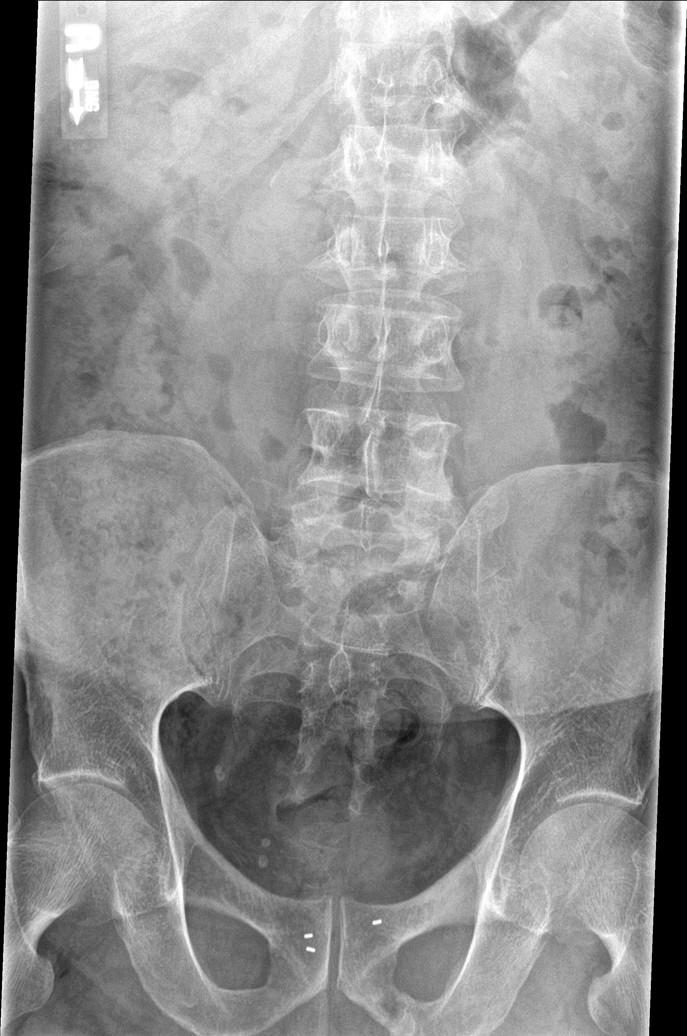

[l-spine obl (1 of 2)]
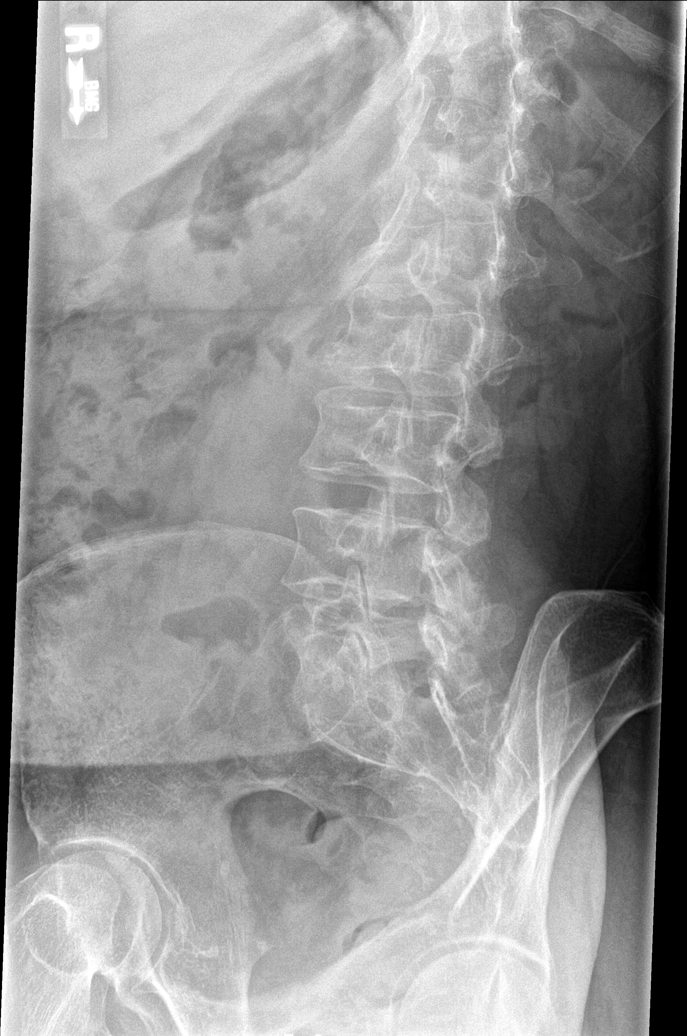

[l-spine obl (2 of 2)]
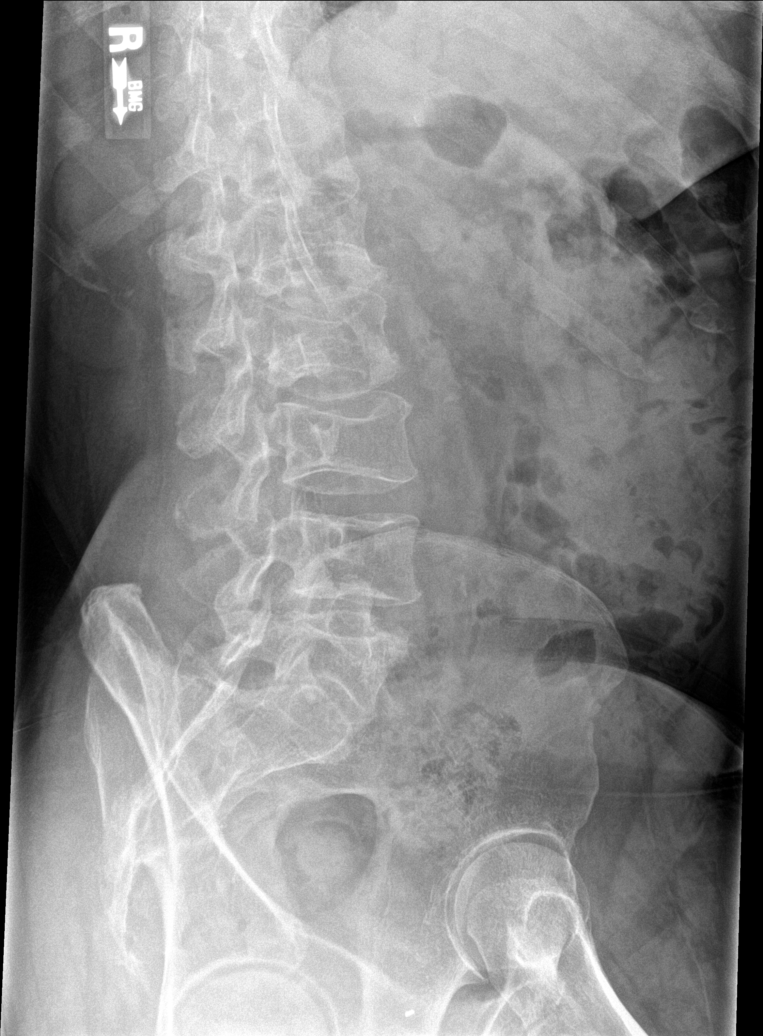

[l-spine lat]
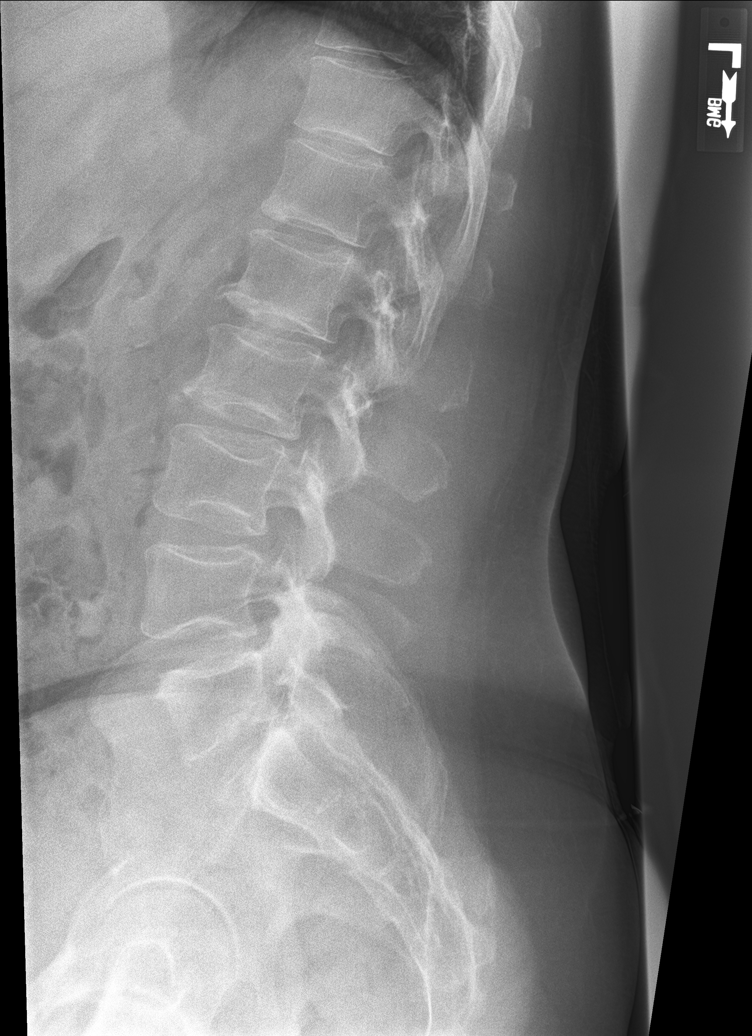

[l-spine l5-s1]
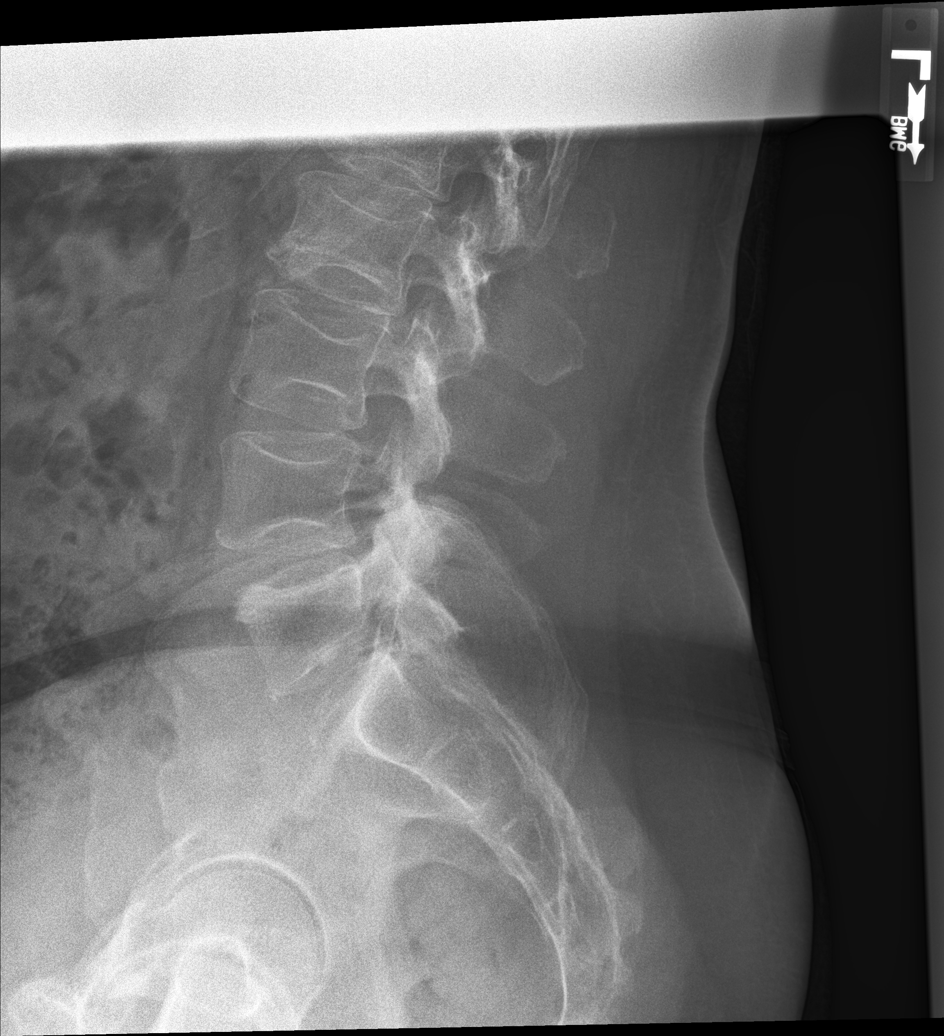

[5 of 5 positions shown; findings below may reference images not displayed]

FINDINGS: Mild disc desiccations within the upper lumbar spine and at the
lumbosacral junction, with slight disc space narrowings and mild
osseous spurring. Probable osseous neural foramen narrowing at the
L5-S1 level, mild to moderate in degree, related to degenerative
facet hypertrophy. No evidence of advanced degenerative change at
any level.

No acute or suspicious osseous finding. No fracture line or
displaced fracture fragment identified. Visualized paravertebral
soft tissues are unremarkable.
IMPRESSION: 1. Degenerative changes, as detailed above.
2. No acute findings.

## 2018-03-16 ENCOUNTER — Other Ambulatory Visit: Payer: Self-pay | Admitting: Family Medicine

## 2018-03-16 NOTE — Telephone Encounter (Signed)
Requested medication (s) are due for refill today: Yes  Requested medication (s) are on the active medication list: Yes  Last refill:  02/16/18  Future visit scheduled: No  Notes to clinic:  Unable to leave message for pt. To schedule OV. Thanks.    Requested Prescriptions  Pending Prescriptions Disp Refills   atorvastatin (LIPITOR) 20 MG tablet [Pharmacy Med Name: ATORVASTATIN 20 MG TABLET] 30 tablet 0    Sig: TAKE 1 TABLET BY MOUTH EVERY DAY     Cardiovascular:  Antilipid - Statins Failed - 03/16/2018  9:32 AM      Failed - Total Cholesterol in normal range and within 360 days    Cholesterol  Date Value Ref Range Status  01/18/2015 202 (H) 125 - 200 mg/dL Final         Failed - LDL in normal range and within 360 days    LDL Cholesterol  Date Value Ref Range Status  01/18/2015 135 (H) <130 mg/dL Final    Comment:      Total Cholesterol/HDL Ratio:CHD Risk                        Coronary Heart Disease Risk Table                                        Men       Women          1/2 Average Risk              3.4        3.3              Average Risk              5.0        4.4           2X Average Risk              9.6        7.1           3X Average Risk             23.4       11.0 Use the calculated Patient Ratio above and the CHD Risk table  to determine the patient's CHD Risk.          Failed - HDL in normal range and within 360 days    HDL  Date Value Ref Range Status  01/18/2015 38 (L) >=40 mg/dL Final         Failed - Triglycerides in normal range and within 360 days    Triglycerides  Date Value Ref Range Status  01/18/2015 143 <150 mg/dL Final         Passed - Patient is not pregnant      Passed - Valid encounter within last 12 months    Recent Outpatient Visits          10 months ago Chronic nonintractable headache, unspecified headache type   [facility], Laurey Arrow, MD   12 months ago Acute nonintractable headache, unspecified headache  type   Primary Care at [facility], Ines Bloomer, MD   1 year ago Essential hypertension   [facility] at [facility], Laurey Arrow, MD   1 year ago Essential hypertension   Primary Care at [place], Laurey Arrow,  MD   1 year ago Right-sided headache   [facility] at [facility], Ranell Patrick, MD

## 2018-03-17 ENCOUNTER — Other Ambulatory Visit: Payer: Self-pay | Admitting: Family Medicine

## 2018-03-17 NOTE — Telephone Encounter (Signed)
Pt request refill for metoprolol; last office visit 02/23/17; no upcoming visits noted; attempted to contact pt at [phone] but unable to leave voicemail; pre-recorded message states voicemail is full.

## 2018-03-17 NOTE — Telephone Encounter (Signed)
Attempted to contact pt at [phone]; no answer; unable to leave voicemail.

## 2018-03-17 NOTE — Telephone Encounter (Signed)
Requested Prescriptions  Pending Prescriptions Disp Refills  . metoprolol succinate (TOPROL-XL) 100 MG 24 hr tablet [Pharmacy Med Name: METOPROLOL SUCC ER 100 MG TAB] 30 tablet 0    Sig: TAKE 1 TABLET (100 MG TOTAL) BY MOUTH DAILY. TAKE WITH OR IMMEDIATELY FOLLOWING A MEAL.     Cardiovascular:  Beta Blockers Failed - 03/17/2018  2:57 AM      Failed - Last BP in normal range    BP Readings from Last 1 Encounters:  02/15/18 (!) 151/90         Failed - Valid encounter within last 6 months    Recent Outpatient Visits          10 months ago Chronic nonintractable headache, unspecified headache type   [facility], Laurey Arrow, MD   12 months ago Acute nonintractable headache, unspecified headache type   Primary Care at [facility], Ines Bloomer, MD   1 year ago Essential hypertension   [facility], Laurey Arrow, MD   1 year ago Essential hypertension   [facility], Laurey Arrow, MD   1 year ago Right-sided headache   Primary Care at [place], MD             Passed - Last Heart Rate in normal range    Pulse Readings from Last 1 Encounters:  02/15/18 89

## 2018-04-21 ENCOUNTER — Other Ambulatory Visit: Payer: Self-pay | Admitting: Family Medicine

## 2018-04-22 NOTE — Telephone Encounter (Signed)
Unable to leave message. Pt. Needs to schedule an appointment.

## 2018-04-27 ENCOUNTER — Other Ambulatory Visit: Payer: Self-pay | Admitting: Family Medicine

## 2018-04-28 NOTE — Telephone Encounter (Signed)
Patient called to both numbers listed, unable to leave a VM on either phone. Patient will need a physical appointment for more refills.

## 2018-05-17 ENCOUNTER — Other Ambulatory Visit: Payer: Self-pay

## 2018-05-17 NOTE — Patient Outreach (Signed)
Matthew Wilkerson) Care Management  05/17/2018  Matthew Wilkerson 14-Jun-1945 [id number]   Medication Adherence call to Mr. Matthew Wilkerson patient did not answer he is due on Olmesartan/ HCTZ 40/25 and Atorvastatin 20 mg [facility] said patient pick up Atorvastatin but not Olmesarta/HCTZ. [name] is showing past due under [place].   [place] Management Direct Dial [phone]  Fax [phone] [email or web address]

## 2018-05-21 ENCOUNTER — Other Ambulatory Visit: Payer: Self-pay | Admitting: Emergency Medicine

## 2018-05-21 ENCOUNTER — Other Ambulatory Visit: Payer: Self-pay | Admitting: Family Medicine

## 2018-05-21 NOTE — Telephone Encounter (Signed)
Requesting refills of Toprol and Lipitor  Pt needs appt and new provider. Unable to leave VM, mail box full

## 2018-05-21 NOTE — Telephone Encounter (Signed)
OLMESARTAN-HCTZ 40-25 MG TAB  Refill request, needs appt and new provider. Attempted to reach pt; unable to leave message , mail box full.

## 2018-05-28 ENCOUNTER — Other Ambulatory Visit: Payer: Self-pay | Admitting: Family Medicine

## 2018-05-28 NOTE — Telephone Encounter (Signed)
Requested medication (s) are due for refill today: yes  Requested medication (s) are on the active medication list: yes  Last refill:  12/24/17 #4 2 RF  Future visit scheduled: no  Notes to clinic:  >3 months overdue for OV. Called both numbers in chart to make appointment and to establish care with another provider . Mailbox is full for number [phone] and for phone number [phone] no answer and answering machine "enter remote access code."   Requested Prescriptions  Pending Prescriptions Disp Refills   metoprolol succinate (TOPROL-XL) 100 MG 24 hr tablet [Pharmacy Med Name: METOPROLOL SUCC ER 100 MG TAB] 15 tablet 0    Sig: TAKE 1 TABLET (100 MG TOTAL) BY MOUTH DAILY. TAKE WITH OR IMMEDIATELY FOLLOWING A MEAL.     Cardiovascular:  Beta Blockers Failed - 05/28/2018 11:19 AM      Failed - Last BP in normal range    BP Readings from Last 1 Encounters:  02/15/18 (!) 151/90         Failed - Valid encounter within last 6 months    Recent Outpatient Visits          1 year ago Chronic nonintractable headache, unspecified headache type   [facility], Laurey Arrow, MD   1 year ago Acute nonintractable headache, unspecified headache type   Primary Care at [facility], Ines Bloomer, MD   1 year ago Essential hypertension   [facility] at [facility], Laurey Arrow, MD   1 year ago Essential hypertension   [facility] at [facility], Laurey Arrow, MD   1 year ago Right-sided headache   Primary Care at [place], MD             Passed - Last Heart Rate in normal range    Pulse Readings from Last 1 Encounters:  02/15/18 89

## 2018-06-05 ENCOUNTER — Encounter (HOSPITAL_COMMUNITY): Payer: Self-pay

## 2018-06-05 ENCOUNTER — Other Ambulatory Visit: Payer: Self-pay

## 2018-06-05 ENCOUNTER — Ambulatory Visit (HOSPITAL_COMMUNITY)
Admission: EM | Admit: 2018-06-05 | Discharge: 2018-06-05 | Disposition: A | Discharge status: Home / Self Care | Payer: Medicare Other | Attending: Family Medicine | Admitting: Family Medicine

## 2018-06-05 DIAGNOSIS — H9201 Otalgia, right ear: Secondary | ICD-10-CM

## 2018-06-05 DIAGNOSIS — E785 Hyperlipidemia, unspecified: Secondary | ICD-10-CM

## 2018-06-05 DIAGNOSIS — I10 Essential (primary) hypertension: Secondary | ICD-10-CM | POA: Diagnosis not present

## 2018-06-05 DIAGNOSIS — M25471 Effusion, right ankle: Secondary | ICD-10-CM

## 2018-06-05 MED ORDER — OLMESARTAN MEDOXOMIL-HCTZ 40-25 MG PO TABS: 1.0000 | ORAL_TABLET | Freq: Every day | ORAL | 0 refills | Status: DC

## 2018-06-05 MED ORDER — METOPROLOL SUCCINATE ER 100 MG PO TB24: ORAL_TABLET | ORAL | 0 refills | Status: DC

## 2018-06-05 MED ORDER — ATORVASTATIN CALCIUM 20 MG PO TABS: 20.0000 mg | ORAL_TABLET | Freq: Every day | ORAL | 0 refills | Status: DC

## 2018-06-05 NOTE — ED Triage Notes (Signed)
Pt cc needs meds refilled meteoprolol, atorvastatin, olmesartan/ Htcz. Right ear discomfort and right ankle swelling. X 3 days

## 2018-06-15 NOTE — ED Provider Notes (Signed)
[place]   [id number] 06/05/18 Arrival Time: 0109  ASSESSMENT & PLAN:  1. Uncontrolled hypertension   2. Hyperlipidemia, unspecified hyperlipidemia type   3. Right ear pain   4. Edema of right ankle     Meds ordered this encounter  Medications  . olmesartan-hydrochlorothiazide (BENICAR HCT) 40-25 MG tablet    Sig: Take 1 tablet by mouth daily.    Dispense:  90 tablet    Refill:  0    PATIENT REQUEST REFILLS  . atorvastatin (LIPITOR) 20 MG tablet    Sig: Take 1 tablet (20 mg total) by mouth daily.    Dispense:  90 tablet    Refill:  0    Office visit needed for refills  . metoprolol succinate (TOPROL-XL) 100 MG 24 hr tablet    Sig: Take with or immediately following a meal.    Dispense:  90 tablet    Refill:  0    Office visit needed for refills    Follow-up Information    Schedule an appointment as soon as possible for a visit  with Shawnee Knapp, MD.   Specialty:  [facility] Contact information: [place] [address] [phone]          Watch edema. May return here if worsening.  Reviewed expectations re: course of current medical issues. Questions answered. Outlined signs and symptoms indicating need for more acute intervention. Patient verbalized understanding. After Visit Summary given.   SUBJECTIVE: History from: patient. Matthew Wilkerson is a 73 y.o. male who is here for refill of medication.  Past Medical History:  Diagnosis Date  . Abnormal PSA 09/2011   7.63,   . Allergy   . BPH (benign prostatic hyperplasia)   . Elevated PSA   . Hernia   . Hyperlipidemia   . Hypertension   . Inguinal hernia    left   . Prostate CA ([place]) 01/08/12   bx=Adenocarcinoma, gleason 6 (3+3), prostate volume 43 cc  . Prostate cancer [facility]) dx 11/2010   prostate ca,PSA=7.2 T1c volume=30cc  . S/P radiation therapy within four to twelve weeks 08/23/12-10/18/12   prostate,7800cGy/40 sessions  . Urinary obstruction    Overall feeling  well. Does report noticing mild R ankle edema over the past few days. Better in the morning. No pain. H/O similar; usually resolves after a few days. No calf swelling or pain. Ambulatory without difficulty.  Also reports mild R ear discomfort over the past 1-2 days. No drainage or bleeding. Normal hearing. No recent illnesses/congestion. No OTC tx.  ROS: As per HPI. All other systems negative.   OBJECTIVE:  Vitals:   06/05/18 1207 06/05/18 1212  BP: (!) 160/100   Pulse: 86   Resp: 18   Temp: 98 F (36.7 C)   TempSrc: Oral   SpO2: 98%   Weight:  85.7 kg    General appearance: alert; no distress Eyes: PERRLA; EOMI; conjunctiva normal HENT: normocephalic; atraumatic; TMs normal; nasal mucosa normal; oral mucosa normal Neck: supple  Lungs: clear to auscultation bilaterally; unlabored Heart: regular rate and rhythm without murmer Abdomen: soft, non-tender; bowel sounds normal; no masses or organomegaly; no guarding or rebound tenderness Back: no CVA tenderness Extremities: mild edema of R ankle, non-tender with FROM; symmetrical with no gross deformities Skin: warm and dry Neurologic: normal gait; normal symmetric reflexes Psychological: alert and cooperative; normal mood and affect  Labs Reviewed: Results for orders placed or performed in visit on 04/25/17  CBC with Differential/Platelet  Result Value Ref Range   WBC 6.8 3.4 - 10.8 x10E3/uL   RBC 4.46 4.14 - 5.80 x10E6/uL   Hemoglobin 14.0 13.0 - 17.7 g/dL   Hematocrit 42.3 37.5 - 51.0 %   MCV 95 79 - 97 fL   MCH 31.4 26.6 - 33.0 pg   MCHC 33.1 31.5 - 35.7 g/dL   RDW 14.6 12.3 - 15.4 %   Platelets 181 150 - 379 x10E3/uL   Neutrophils 65 Not Estab. %   Lymphs 22 Not Estab. %   Monocytes 11 Not Estab. %   Eos 2 Not Estab. %   Basos 0 Not Estab. %   Neutrophils Absolute 4.3 1.4 - 7.0 x10E3/uL   Lymphocytes Absolute 1.5 0.7 - 3.1 x10E3/uL   Monocytes Absolute 0.7 0.1 - 0.9 x10E3/uL   EOS (ABSOLUTE) 0.2 0.0 - 0.4 x10E3/uL    Basophils Absolute 0.0 0.0 - 0.2 x10E3/uL   Immature Granulocytes 0 Not Estab. %   Immature Grans (Abs) 0.0 0.0 - 0.1 x10E3/uL  Comprehensive metabolic panel  Result Value Ref Range   Glucose 104 (H) 65 - 99 mg/dL   BUN 24 8 - 27 mg/dL   Creatinine, Ser 1.43 (H) 0.76 - 1.27 mg/dL   GFR calc non Af Amer 48 (L) >59 mL/min/1.73   GFR calc Af Amer 56 (L) >59 mL/min/1.73   BUN/Creatinine Ratio 17 10 - 24   Sodium 143 134 - 144 mmol/L   Potassium 4.0 3.5 - 5.2 mmol/L   Chloride 106 96 - 106 mmol/L   CO2 19 (L) 20 - 29 mmol/L   Calcium 9.5 8.6 - 10.2 mg/dL   Total Protein 7.5 6.0 - 8.5 g/dL   Albumin 4.5 3.5 - 4.8 g/dL   Globulin, Total 3.0 1.5 - 4.5 g/dL   Albumin/Globulin Ratio 1.5 1.2 - 2.2   Bilirubin Total 0.4 0.0 - 1.2 mg/dL   Alkaline Phosphatase 88 39 - 117 IU/L   AST 32 0 - 40 IU/L   ALT 28 0 - 44 IU/L  Sedimentation Rate  Result Value Ref Range   Sed Rate 40 (H) 0 - 30 mm/hr   Labs Reviewed - No data to display   Allergies  Allergen Reactions  . Aspirin Nausea Only    High dose asp.... He tolerates 81 mg   . Codeine Other (See Comments)    Derivatives, "causes my sinuses to hurt"    Past Medical History:  Diagnosis Date  . Abnormal PSA 09/2011   7.63,   . Allergy   . BPH (benign prostatic hyperplasia)   . Elevated PSA   . Hernia   . Hyperlipidemia   . Hypertension   . Inguinal hernia    left   . 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 Prostate cancer [facility]) dx 11/2010   prostate ca,PSA=7.2 T1c volume=30cc  . S/P radiation therapy within four to twelve weeks 08/23/12-10/18/12   prostate,7800cGy/40 sessions  . Urinary obstruction    Social History   Socioeconomic History  . Marital status: Divorced    Spouse name: Not on file  . Number of children: 2  . Years of education: Not on file  . Highest education level: Not on file  Occupational History  . Occupation: Retired    Comment: Insurance underwriter habitat for [employer]  . Financial resource strain: Not on file  . Food insecurity:    Worry:  Not on file    Inability: Not on file  . Transportation needs:    Medical: Not on file    Non-medical: Not on file  Tobacco Use  . Smoking status: Never Smoker  . Smokeless tobacco: Never Used  Substance and Sexual Activity  . Alcohol use: No  . Drug use: Yes    Types: MDMA (Ecstacy)  . Sexual activity: Yes  Lifestyle  . Physical activity:    Days per week: Not on file    Minutes per session: Not on file  . Stress: Not on file  Relationships  . Social connections:    Talks on phone: Not on file    Gets together: Not on file    Attends religious service: Not on file    Active member of club or organization: Not on file    Attends meetings of clubs or organizations: Not on file    Relationship status: Not on file  . Intimate partner violence:    Fear of current or ex partner: Not on file    Emotionally abused: Not on file    Physically abused: Not on file    Forced sexual activity: Not on file  Other Topics Concern  . Not on file  Social History Narrative  . Not on file   Family History  Problem Relation Age of Onset  . Cancer Sister 90       pancreatic   Past Surgical History:  Procedure Laterality Date  . HERNIA REPAIR     right inguinal done in early 70's  . PROSTATE BIOPSY  01/08/12   Adenocarcinoma,gleason=3+3=6,& 3+4=7,volume=43cc      Vanessa Kick, MD 06/15/18 [phone]

## 2018-08-31 ENCOUNTER — Encounter: Payer: Self-pay | Admitting: Family Medicine

## 2018-08-31 ENCOUNTER — Telehealth (INDEPENDENT_AMBULATORY_CARE_PROVIDER_SITE_OTHER): Payer: Medicare Other | Admitting: Family Medicine

## 2018-08-31 ENCOUNTER — Other Ambulatory Visit: Payer: Self-pay

## 2018-08-31 DIAGNOSIS — I10 Essential (primary) hypertension: Secondary | ICD-10-CM | POA: Diagnosis not present

## 2018-08-31 DIAGNOSIS — E7800 Pure hypercholesterolemia, unspecified: Secondary | ICD-10-CM

## 2018-08-31 MED ORDER — METOPROLOL SUCCINATE ER 100 MG PO TB24: ORAL_TABLET | ORAL | 0 refills | Status: DC

## 2018-08-31 MED ORDER — ATORVASTATIN CALCIUM 20 MG PO TABS: ORAL_TABLET | ORAL | 0 refills | Status: DC

## 2018-08-31 MED ORDER — ATORVASTATIN CALCIUM 20 MG PO TABS: 20.0000 mg | ORAL_TABLET | Freq: Every day | ORAL | 0 refills | Status: DC

## 2018-08-31 MED ORDER — OLMESARTAN MEDOXOMIL-HCTZ 40-25 MG PO TABS: ORAL_TABLET | ORAL | 0 refills | Status: DC

## 2018-08-31 NOTE — Progress Notes (Signed)
Telemedicine Encounter- SOAP NOTE Established Patient  I discussed the limitations, risks, security and privacy concerns of performing an evaluation and management service by telephone and the availability of in person appointments. I also discussed with the patient that there may be a patient responsible charge related to this service. The patient expressed understanding and agreed to proceed.  This telephone encounter was conducted with the patient'sverbal consent via audio telecommunications: yes Patient was instructed to have this encounter in a suitably private space; and to only have persons present to whom they give permission to participate. In addition, patient identity was confirmed by use of name plus two identifiers (DOB and address).  I spent a total of 10 min talking with the patient.  pt needs medication refills for HTN  Subjective   Matthew Wilkerson is a 73 y.o. male established patient. Telephone visit today for refills on medication for HTN. Pt states he has been seen recently in urology office for f/u on prostate CA. Pt states he had blood work completed there. Pt denies headaches, LE swelling , CP or other concerns. Pt states he takes bp at grocery or pharmacy and it has been "normal" pt is afraid to come into office due to [place].     Patient Active Problem List   Diagnosis Date Noted   Acute nonintractable headache 03/19/2017   Sinus pressure 03/19/2017   Class 1 obesity due to excess calories with serious comorbidity and body mass index (BMI) of 32.0 to 32.9 in adult 12/14/2016   Hyperlipidemia 08/17/2012   Headache 08/17/2012   Prostate cancer ([place]) 01/08/2012   HTN (hypertension) 08/01/2011   Left inguinal hernia 11/20/2010    Past Medical History:  Diagnosis Date   Abnormal PSA 09/2011   7.63,    Allergy    BPH (benign prostatic hyperplasia)    Elevated PSA    Hernia    Hyperlipidemia    Hypertension    Inguinal hernia    left     Prostate CA ([place]) 01/08/12   bx=Adenocarcinoma, gleason 6 (3+3), prostate volume 43 cc   Prostate cancer ([place]) dx 11/2010   prostate ca,PSA=7.2 T1c volume=30cc   S/P radiation therapy within four to twelve weeks 08/23/12-10/18/12   prostate,7800cGy/40 sessions   Urinary obstruction     Current Outpatient Medications  Medication Sig Dispense Refill   aspirin EC 81 MG tablet Take 1 tablet (81 mg total) by mouth daily.     atorvastatin (LIPITOR) 20 MG tablet Take 1 tablet (20 mg total) by mouth daily. 90 tablet 0   metoprolol succinate (TOPROL-XL) 100 MG 24 hr tablet Take with or immediately following a meal. 90 tablet 0   olmesartan-hydrochlorothiazide (BENICAR HCT) 40-25 MG tablet Take 1 tablet by mouth daily. 90 tablet 0   guaiFENesin (MUCINEX) 600 MG 12 hr tablet Take 1 tablet (600 mg total) by mouth 2 (two) times daily. (Patient not taking: Reported on 08/31/2018) 56 tablet 0   mometasone (NASONEX) 50 MCG/ACT nasal spray Place 2 sprays into the nose daily. (Patient not taking: Reported on 08/31/2018) 17 g 2   traMADol (ULTRAM) 50 MG tablet Take 2 tablets (100 mg total) by mouth every 8 (eight) hours as needed. (Patient not taking: Reported on 02/15/2018) 40 tablet 1   No current facility-administered medications for this visit.     Allergies  Allergen Reactions   Aspirin Nausea Only    High dose asp.... 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 [employer] resource strain: Not on file   Food insecurity:    Worry: Not on file    Inability: Not on file   Transportation needs:     Medical: Not on file    Non-medical: Not on file  Tobacco Use   Smoking status: Never Smoker   Smokeless tobacco: Never Used  Substance and Sexual Activity   Alcohol use: No   Drug use: Yes    Types: MDMA Banker)   Sexual activity: Yes  Lifestyle   Physical activity:    Days per week: Not on file    Minutes per session: Not on file   Stress: Not on file  Relationships   Social connections:    Talks on phone: Not on file    Gets together: Not on file    Attends religious service: Not on file    Active member of club or organization: Not on file    Attends meetings of clubs or organizations: Not on file    Relationship status: Not on file   Intimate partner violence:    Fear of current or ex partner: Not on file    Emotionally abused: Not on file    Physically abused: Not on file    Forced sexual activity: Not on file  Other Topics Concern   Not on file  Social History Narrative   Not on file    ROS CONSTITUTIONAL: no weight loss, fever EENT: no sinus problems CV: no chest pain, no swelling of the legs or feet RESP: no SOB NEURO: nofrequent headaches  Objective   Vitals as reported by the patient: Normal per pt at pharmacy/grocery 1. Essential hypertension  olmesartan-HCT-rx Metoprolol-rx 2. Hyperlipidemia-atorvastatin-rx  Needs fasting labwork-orders placed for future labwork to be completed-d/w pt importance for medications I discussed the assessment and treatment plan with the patient. The patient was provided an opportunity to ask questions and all were answered. The patient agreed with the plan and demonstrated an understanding of the instructions.   The patient was advised to call back or seek an in-person evaluation if the symptoms worsen or if the condition fails to improve as anticipated.  I provided 10 minutes of non-face-to-face time during this encounter.  Blayden Conwell Hannah Beat, MD  Primary Care at [facility] 08-31-18

## 2018-08-31 NOTE — Progress Notes (Signed)
Pt c/o medication refill of atorvastatin, metoprolol, and olmesartan medoxomil-hctz.

## 2018-09-01 ENCOUNTER — Other Ambulatory Visit: Payer: Self-pay

## 2018-11-24 ENCOUNTER — Other Ambulatory Visit: Payer: Self-pay | Admitting: Family Medicine

## 2018-11-24 MED ORDER — OLMESARTAN MEDOXOMIL-HCTZ 40-25 MG PO TABS: ORAL_TABLET | ORAL | 0 refills | Status: DC

## 2018-11-24 MED ORDER — ATORVASTATIN CALCIUM 20 MG PO TABS: ORAL_TABLET | ORAL | 0 refills | Status: DC

## 2018-11-24 MED ORDER — METOPROLOL SUCCINATE ER 100 MG PO TB24: ORAL_TABLET | ORAL | 0 refills | Status: DC

## 2018-11-24 NOTE — Telephone Encounter (Signed)
Requested Prescriptions  Pending Prescriptions Disp Refills  . olmesartan-hydrochlorothiazide (BENICAR HCT) 40-25 MG tablet 30 tablet 0    Sig: Take one po qd     Cardiovascular: ARB + Diuretic Combos Failed - 11/24/2018  4:17 PM      Failed - K in normal range and within 180 days    Potassium  Date Value Ref Range Status  04/25/2017 4.0 3.5 - 5.2 mmol/L Final         Failed - Na in normal range and within 180 days    Sodium  Date Value Ref Range Status  04/25/2017 143 134 - 144 mmol/L Final         Failed - Cr in normal range and within 180 days    Creat  Date Value Ref Range Status  11/09/2015 1.36 (H) 0.70 - 1.18 mg/dL Final    Comment:      For patients > or = 75 years of age: The upper reference limit for Creatinine is approximately 13% higher for people identified as African-American.      Creatinine, Ser  Date Value Ref Range Status  04/25/2017 1.43 (H) 0.76 - 1.27 mg/dL Final         Failed - Ca in normal range and within 180 days    Calcium  Date Value Ref Range Status  04/25/2017 9.5 8.6 - 10.2 mg/dL Final         Failed - Last BP in normal range    BP Readings from Last 1 Encounters:  06/05/18 (!) 160/100         Failed - Valid encounter within last 6 months    Recent Outpatient Visits          1 year ago Chronic nonintractable headache, unspecified headache type   [facility], Laurey Arrow, MD   1 year ago Acute nonintractable headache, unspecified headache type   [facility] at [facility], Ines Bloomer, MD   1 year ago Essential hypertension   [facility], Laurey Arrow, MD   1 year ago Essential hypertension   [facility], Laurey Arrow, MD   1 year ago Right-sided headache   [facility], Ranell Patrick, MD             Passed - Patient is not pregnant      . atorvastatin (LIPITOR) 20 MG tablet 30 tablet 0    Sig: Take one po qhs     Cardiovascular:  Antilipid - Statins Failed - 11/24/2018  4:17  PM      Failed - Total Cholesterol in normal range and within 360 days    Cholesterol  Date Value Ref Range Status  01/18/2015 202 (H) 125 - 200 mg/dL Final         Failed - LDL in normal range and within 360 days    LDL Cholesterol  Date Value Ref Range Status  01/18/2015 135 (H) <130 mg/dL Final    Comment:      Total Cholesterol/HDL Ratio:CHD Risk                        Coronary Heart Disease Risk Table                                        Men  Women          1/2 Average Risk              3.4        3.3              Average Risk              5.0        4.4           2X Average Risk              9.6        7.1           3X Average Risk             23.4       11.0 Use the calculated Patient Ratio above and the CHD Risk table  to determine the patient's CHD Risk.          Failed - HDL in normal range and within 360 days    HDL  Date Value Ref Range Status  01/18/2015 38 (L) >=40 mg/dL Final         Failed - Triglycerides in normal range and within 360 days    Triglycerides  Date Value Ref Range Status  01/18/2015 143 <150 mg/dL Final         Failed - Valid encounter within last 12 months    Recent Outpatient Visits          1 year ago Chronic nonintractable headache, unspecified headache type   [facility], Laurey Arrow, MD   1 year ago Acute nonintractable headache, unspecified headache type   [facility] Care at [facility], Ines Bloomer, MD   1 year ago Essential hypertension   [facility], Laurey Arrow, MD   1 year ago Essential hypertension   [facility], Laurey Arrow, MD   1 year ago Right-sided headache   [facility], Ranell Patrick, MD             Passed - Patient is not pregnant      . metoprolol succinate (TOPROL-XL) 100 MG 24 hr tablet 30 tablet 0    Sig: Take one po day     Cardiovascular:  Beta Blockers Failed - 11/24/2018  4:17 PM      Failed - Last BP in normal range    BP Readings from Last 1  Encounters:  06/05/18 (!) 160/100         Failed - Valid encounter within last 6 months    Recent Outpatient Visits          1 year ago Chronic nonintractable headache, unspecified headache type   [facility], Laurey Arrow, MD   1 year ago Acute nonintractable headache, unspecified headache type   Primary Care at [facility], Ines Bloomer, MD   1 year ago Essential hypertension   [facility] at [facility], Laurey Arrow, MD   1 year ago Essential hypertension   [facility] at [facility], Laurey Arrow, MD   1 year ago Right-sided headache   [facility], Ranell Patrick, MD             Passed - Last Heart Rate in normal range    Pulse Readings from Last 1 Encounters:  06/05/18 86         Spoke with patient, advised him  he is due for follow up and blood work. Transferred him to [facility] to schedule follow up appointment. 30 day courtesy refill sent.

## 2018-11-24 NOTE — Telephone Encounter (Signed)
Pt request refill   atorvastatin (LIPITOR) 20 MG tablet metoprolol succinate (TOPROL-XL) 100 MG 24 hr tablet olmesartan-hydrochlorothiazide (BENICAR HCT) 40-25 MG tablet  [id number] - [place], [place] [phone] (Phone) [phone] (Fax)    cb [phone]

## 2018-12-07 ENCOUNTER — Encounter: Payer: Self-pay | Admitting: Registered Nurse

## 2018-12-07 ENCOUNTER — Ambulatory Visit (INDEPENDENT_AMBULATORY_CARE_PROVIDER_SITE_OTHER): Payer: Medicare Other | Admitting: Registered Nurse

## 2018-12-07 ENCOUNTER — Other Ambulatory Visit: Payer: Self-pay

## 2018-12-07 VITALS — BP 140/80 | HR 82 | Temp 98.7°F | Resp 18 | Wt 215.0 lb

## 2018-12-07 DIAGNOSIS — E7800 Pure hypercholesterolemia, unspecified: Secondary | ICD-10-CM | POA: Diagnosis not present

## 2018-12-07 DIAGNOSIS — Z130 Encounter for screening for diseases of the blood and blood-forming organs and certain disorders involving the immune mechanism: Secondary | ICD-10-CM

## 2018-12-07 DIAGNOSIS — Z13220 Encounter for screening for lipoid disorders: Secondary | ICD-10-CM | POA: Diagnosis not present

## 2018-12-07 DIAGNOSIS — I10 Essential (primary) hypertension: Secondary | ICD-10-CM

## 2018-12-07 DIAGNOSIS — Z13228 Encounter for screening for other metabolic disorders: Secondary | ICD-10-CM

## 2018-12-07 DIAGNOSIS — Z1329 Encounter for screening for other suspected endocrine disorder: Secondary | ICD-10-CM

## 2018-12-07 DIAGNOSIS — H269 Unspecified cataract: Secondary | ICD-10-CM

## 2018-12-07 MED ORDER — ATORVASTATIN CALCIUM 20 MG PO TABS: ORAL_TABLET | ORAL | 1 refills | Status: DC

## 2018-12-07 MED ORDER — OLMESARTAN MEDOXOMIL-HCTZ 40-25 MG PO TABS: ORAL_TABLET | ORAL | 1 refills | Status: DC

## 2018-12-07 MED ORDER — METOPROLOL SUCCINATE ER 100 MG PO TB24: ORAL_TABLET | ORAL | 1 refills | Status: DC

## 2018-12-07 NOTE — Progress Notes (Signed)
Established Patient Office Visit  Subjective:  Patient ID: Matthew Wilkerson, male    DOB: March 12, 1946  Age: 73 y.o. MRN: [id number]  CC:  Chief Complaint  Patient presents with   Establish Care    TOC need new pcp to manage medications and Chronic Conditions    Medication Refill    HPI Matthew Wilkerson presents for [facility], med refills, and cataracts  Formerly a pt of Dr. Everlene Farrier and subsequently Dr. Brigitte Pulse. His is being treated by our office for HTN and HLD. He has a history of prostate cancer, for which he has been dismissed from follow up by [facility].   HTN: Taking metoprolol XL 100mg  PO qd and Olmesartan-HCTZ 40-25mg  PO qd with good effect. Will continue. Denies sxs of HTN including headache, SHOB, chest pain, visual changes, swelling of lower extremities  HLD: Taking Atorvastatin 20mg  PO qd with good effect, though it has been over 18 months since his last lab work. We will check his lipid panel today.  Cataracts: states that they were first noticed by Dr. Everlene Farrier, they have since mildly worsened but he feels ready to have them addressed by ophthalmology. A referral will be placed.   Past Medical History:  Diagnosis Date   Abnormal PSA 09/2011   7.63,    Allergy    BPH (benign prostatic hyperplasia)    Elevated PSA    Hernia    Hyperlipidemia    Hypertension    Inguinal hernia    left    Prostate CA ([place]) 01/08/12   bx=Adenocarcinoma, gleason 6 (3+3), prostate volume 43 cc   Prostate cancer ([place]) dx 11/2010   prostate ca,PSA=7.2 T1c volume=30cc   S/P radiation therapy within four to twelve weeks 08/23/12-10/18/12   prostate,7800cGy/40 sessions   Urinary obstruction     Past Surgical History:  Procedure Laterality Date   HERNIA REPAIR     right inguinal done in early 70's   PROSTATE BIOPSY  01/08/12   Adenocarcinoma,gleason=3+3=6,& 3+4=7,volume=43cc     Family History  Problem Relation Age of Onset   Cancer Sister 38       pancreatic    Social  History   Socioeconomic History   Marital status: Divorced    Spouse name: Not on file   Number of children: 2   Years of education: Not on file   Highest education level: Not on file  Occupational History   Occupation: Retired    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 strain: Not hard at all   Food insecurity    Worry: Never true    Inability: Never true   Transportation needs    Medical: No    Non-medical: No  Tobacco Use   Smoking status: Never Smoker   Smokeless tobacco: Never Used  Substance and Sexual Activity   Alcohol use: No   Drug use: Yes    Types: MDMA Banker)   Sexual activity: Yes  Lifestyle   Physical activity    Days per week: 3 days    Minutes per session: 30 min   Stress: Not at all  Relationships   Social connections    Talks on phone: Three times a week    Gets together: Twice a week    Attends religious service: Patient refused    Active member of club or organization: Patient refused    Attends meetings of clubs or organizations: Patient refused    Relationship  status: Divorced   Intimate partner violence    Fear of current or ex partner: No    Emotionally abused: No    Physically abused: No    Forced sexual activity: No  Other Topics Concern   Not on file  Social History Narrative   Not on file    Outpatient Medications Prior to Visit  Medication Sig Dispense Refill   aspirin EC 81 MG tablet Take 1 tablet (81 mg total) by mouth daily.     mometasone (NASONEX) 50 MCG/ACT nasal spray Place 2 sprays into the nose daily. 17 g 2   atorvastatin (LIPITOR) 20 MG tablet Take one po qhs 30 tablet 0   guaiFENesin (MUCINEX) 600 MG 12 hr tablet Take 1 tablet (600 mg total) by mouth 2 (two) times daily. 56 tablet 0   metoprolol succinate (TOPROL-XL) 100 MG 24 hr tablet Take one po day 30 tablet 0   olmesartan-hydrochlorothiazide (BENICAR HCT) 40-25  MG tablet Take one po qd 30 tablet 0   traMADol (ULTRAM) 50 MG tablet Take 2 tablets (100 mg total) by mouth every 8 (eight) hours as needed. 40 tablet 1   No facility-administered medications prior to visit.     Allergies  Allergen Reactions   Aspirin Nausea Only    High dose asp.... He tolerates 81 mg    Codeine Other (See Comments)    Derivatives, "causes my sinuses to hurt"    ROS Review of Systems  Constitutional: Negative.   HENT: Negative.   Eyes: Positive for visual disturbance (cataracts). Negative for photophobia, pain, discharge, redness and itching.  Respiratory: Negative.   Cardiovascular: Negative.   Gastrointestinal: Negative.   Endocrine: Negative.   Genitourinary: Negative.   Musculoskeletal: Negative.   Skin: Negative.   Allergic/Immunologic: Negative.   Neurological: Negative.   Hematological: Negative.   Psychiatric/Behavioral: Negative.   All other systems reviewed and are negative.     Objective:    Physical Exam  Constitutional: He is oriented to person, place, and time. He appears well-developed and well-nourished. No distress.  Cardiovascular: Normal rate, regular rhythm, normal heart sounds and intact distal pulses. Exam reveals no gallop and no friction rub.  No murmur heard. Pulmonary/Chest: Effort normal and breath sounds normal. No respiratory distress. He has no wheezes. He has no rales. He exhibits no tenderness.  Abdominal: Soft. Bowel sounds are normal. He exhibits no distension. There is no abdominal tenderness.  Neurological: He is alert and oriented to person, place, and time.  Skin: Skin is warm and dry. No rash noted. He is not diaphoretic. No erythema. No pallor.  Psychiatric: He has a normal mood and affect. His behavior is normal. Judgment and thought content normal.  Nursing note and vitals reviewed.   BP (!) 147/77    Pulse 82    Temp 98.7 F (37.1 C) (Oral)    Resp 18    Wt 215 lb (97.5 kg)    SpO2 98%    BMI 33.67  kg/m  Wt Readings from Last 3 Encounters:  12/07/18 215 lb (97.5 kg)  06/05/18 189 lb (85.7 kg)  02/15/18 189 lb (85.7 kg)     Health Maintenance Due  Topic Date Due   COLONOSCOPY  11/29/1993   PNA vac Low Risk Adult (1 of 2 - PCV13) 11/29/2008    There are no preventive care reminders to display for this patient.  Lab Results  Component Value Date   TSH 4.07 11/09/2015   Lab Results  Component Value Date   WBC 6.8 04/25/2017   HGB 14.0 04/25/2017   HCT 42.3 04/25/2017   MCV 95 04/25/2017   PLT 181 04/25/2017   Lab Results  Component Value Date   NA 143 04/25/2017   K 4.0 04/25/2017   CO2 19 (L) 04/25/2017   GLUCOSE 104 (H) 04/25/2017   BUN 24 04/25/2017   CREATININE 1.43 (H) 04/25/2017   BILITOT 0.4 04/25/2017   ALKPHOS 88 04/25/2017   AST 32 04/25/2017   ALT 28 04/25/2017   PROT 7.5 04/25/2017   ALBUMIN 4.5 04/25/2017   CALCIUM 9.5 04/25/2017   Lab Results  Component Value Date   CHOL 202 (H) 01/18/2015   Lab Results  Component Value Date   HDL 38 (L) 01/18/2015   Lab Results  Component Value Date   LDLCALC 135 (H) 01/18/2015   Lab Results  Component Value Date   TRIG 143 01/18/2015   Lab Results  Component Value Date   CHOLHDL 5.3 (H) 01/18/2015   Lab Results  Component Value Date   HGBA1C 6.1 (H) 02/23/2017      Assessment & Plan:   Problem List Items Addressed This Visit      Cardiovascular and Mediastinum   Essential hypertension   Relevant Medications   atorvastatin (LIPITOR) 20 MG tablet   metoprolol succinate (TOPROL-XL) 100 MG 24 hr tablet   olmesartan-hydrochlorothiazide (BENICAR HCT) 40-25 MG tablet     Other   Pure hypercholesterolemia   Relevant Medications   atorvastatin (LIPITOR) 20 MG tablet   metoprolol succinate (TOPROL-XL) 100 MG 24 hr tablet   olmesartan-hydrochlorothiazide (BENICAR HCT) 40-25 MG tablet    Other Visit Diagnoses    Screening for endocrine, metabolic and immunity disorder    -  Primary    Relevant Orders   CBC   Comprehensive metabolic panel   Hemoglobin A1c   TSH   Lipid screening       Relevant Orders   Lipid panel   Cataract of both eyes, unspecified cataract type       Relevant Orders   Ambulatory referral to Ophthalmology      Meds ordered this encounter  Medications   atorvastatin (LIPITOR) 20 MG tablet    Sig: Take 1 tablet each evening.    Dispense:  90 tablet    Refill:  1    Office visit needed for future refills    Order Specific Question:   Supervising Provider    Answer:   Forrest Moron [[id number]]   metoprolol succinate (TOPROL-XL) 100 MG 24 hr tablet    Sig: Take 1 tablet each morning.    Dispense:  90 tablet    Refill:  1    Office visit needed for future refills    Order Specific Question:   Supervising Provider    Answer:   Forrest Moron [[id number]]   olmesartan-hydrochlorothiazide (BENICAR HCT) 40-25 MG tablet    Sig: Take 1 tablet each morning.    Dispense:  90 tablet    Refill:  1    Office visit needed for future refills.    Order Specific Question:   Supervising Provider    Answer:   Forrest Moron [[id number]]    Follow-up: Return in about 6 months (around 06/09/2019).   PLAN  Refilled meds  Labs ordered, will follow up as warranted  Pt declined colonoscopy referral at this time. We discussed the risks and benefits of this. He states that he has not had  any bowel changes lately, including NVD constipation blood in stool melena or any other concerns. He understands that this routine screening is strongly recommended. We will address this again at a future visit  Should lab results be encouraging, a 45mo follow up is warranted. Otherwise, we may have him return to clinic sooner.   Patient encouraged to call clinic with any questions, comments, or concerns.   Maximiano Coss, NP

## 2018-12-07 NOTE — Patient Instructions (Addendum)
° ° ° °  If you have lab work done today you will be contacted with your lab results within the next 2 weeks.  If you have not heard from us then please contact us. The fastest way to get your results is to register for My Chart. ° ° °IF you received an x-ray today, you will receive an invoice from [facility]. Please contact [facility] at [phone] with questions or concerns regarding your invoice.  ° °IF you received labwork today, you will receive an invoice from [facility]. Please contact [facility] at [phone] with questions or concerns regarding your invoice.  ° °Our billing staff will not be able to assist you with questions regarding bills from these companies. ° °You will be contacted with the lab results as soon as they are available. The fastest way to get your results is to activate your My Chart account. Instructions are located on the last page of this paperwork. If you have not heard from us regarding the results in 2 weeks, please contact this office. °  ° ° ° °

## 2018-12-08 LAB — COMPREHENSIVE METABOLIC PANEL
ALT: 26 IU/L (ref 0–44)
AST: 28 IU/L (ref 0–40)
Albumin/Globulin Ratio: 1.4 (ref 1.2–2.2)
Albumin: 4.2 g/dL (ref 3.7–4.7)
Alkaline Phosphatase: 103 IU/L (ref 39–117)
BUN/Creatinine Ratio: 16 (ref 10–24)
BUN: 26 mg/dL (ref 8–27)
Bilirubin Total: 0.3 mg/dL (ref 0.0–1.2)
CO2: 22 mmol/L (ref 20–29)
Calcium: 9.3 mg/dL (ref 8.6–10.2)
Chloride: 105 mmol/L (ref 96–106)
Creatinine, Ser: 1.65 mg/dL — ABNORMAL HIGH (ref 0.76–1.27)
GFR calc Af Amer: 46 mL/min/{1.73_m2} — ABNORMAL LOW (ref >59)
GFR calc non Af Amer: 40 mL/min/{1.73_m2} — ABNORMAL LOW (ref >59)
Globulin, Total: 3 g/dL (ref 1.5–4.5)
Glucose: 86 mg/dL (ref 65–99)
Potassium: 4 mmol/L (ref 3.5–5.2)
Sodium: 143 mmol/L (ref 134–144)
Total Protein: 7.2 g/dL (ref 6.0–8.5)

## 2018-12-08 LAB — CBC
Hematocrit: 41.5 % (ref 37.5–51.0)
Hemoglobin: 13.5 g/dL (ref 13.0–17.7)
MCH: 31.1 pg (ref 26.6–33.0)
MCHC: 32.5 g/dL (ref 31.5–35.7)
MCV: 96 fL (ref 79–97)
Platelets: 151 10*3/uL (ref 150–450)
RBC: 4.34 x10E6/uL (ref 4.14–5.80)
RDW: 14.4 % (ref 11.6–15.4)
WBC: 6.6 10*3/uL (ref 3.4–10.8)

## 2018-12-08 LAB — HEMOGLOBIN A1C
Est. average glucose Bld gHb Est-mCnc: 128 mg/dL
Hgb A1c MFr Bld: 6.1 % — ABNORMAL HIGH (ref 4.8–5.6)

## 2018-12-08 LAB — LIPID PANEL
Chol/HDL Ratio: 3.6 ratio (ref 0.0–5.0)
Cholesterol, Total: 144 mg/dL (ref 100–199)
HDL: 40 mg/dL (ref >39)
LDL Calculated: 84 mg/dL (ref 0–99)
Triglycerides: 98 mg/dL (ref 0–149)
VLDL Cholesterol Cal: 20 mg/dL (ref 5–40)

## 2018-12-08 LAB — TSH: TSH: 5.35 u[IU]/mL — ABNORMAL HIGH (ref 0.450–4.500)

## 2018-12-10 ENCOUNTER — Encounter: Payer: Self-pay | Admitting: Registered Nurse

## 2018-12-10 NOTE — Progress Notes (Signed)
Results letter sent to patient GFR mildly lower than last year Cr mildly higher than last year. A1c elevated but steady TSH asymptomatically high Otherwise unremarkable  Kathrin Ruddy, NP

## 2018-12-17 ENCOUNTER — Telehealth: Payer: Self-pay | Admitting: Registered Nurse

## 2018-12-17 NOTE — Telephone Encounter (Signed)
Informed pt of labs, he verbalized understanding.

## 2018-12-17 NOTE — Telephone Encounter (Signed)
Patient would like a phone call and discuss his blood work

## 2018-12-17 NOTE — Telephone Encounter (Signed)
There's a letter in his chart, I believe I had mailed it out to him. Overall his labs are ok. His GFR has fallen and his Cr is mildly up, indicating that his kidneys are slowing down, but the change is not substantial to take action at this time. He is in subclinical hypothyroidism, meaning his TSH is mildly elevated but he didn't have any major symptoms when he was here (fatigue, brittle hair or nails, cold intolerance) so we will not medicate for this. His A1c is elevated to a prediabetic 6.1% but this is fine for him at 73 yo, he should just stay conscious of his diet. Thank you,  Kathrin Ruddy, NP

## 2018-12-17 NOTE — Telephone Encounter (Signed)
Please review labs and I will call pt 

## 2019-03-11 ENCOUNTER — Other Ambulatory Visit: Payer: Self-pay | Admitting: Registered Nurse

## 2019-03-11 DIAGNOSIS — I10 Essential (primary) hypertension: Secondary | ICD-10-CM

## 2019-06-10 ENCOUNTER — Telehealth (INDEPENDENT_AMBULATORY_CARE_PROVIDER_SITE_OTHER): Payer: Medicare Other | Admitting: Registered Nurse

## 2019-06-10 ENCOUNTER — Other Ambulatory Visit: Payer: Self-pay

## 2019-06-10 NOTE — Progress Notes (Signed)
Patient stated he follow up

## 2019-06-17 ENCOUNTER — Other Ambulatory Visit: Payer: Self-pay

## 2019-06-17 ENCOUNTER — Encounter: Payer: Self-pay | Admitting: Registered Nurse

## 2019-06-17 ENCOUNTER — Ambulatory Visit (INDEPENDENT_AMBULATORY_CARE_PROVIDER_SITE_OTHER): Payer: Medicare Other | Admitting: Registered Nurse

## 2019-06-17 VITALS — BP 153/89 | HR 91 | Temp 97.8°F | Ht 67.0 in | Wt 220.8 lb

## 2019-06-17 DIAGNOSIS — S76312A Strain of muscle, fascia and tendon of the posterior muscle group at thigh level, left thigh, initial encounter: Secondary | ICD-10-CM

## 2019-06-17 MED ORDER — METHOCARBAMOL 500 MG PO TABS: 500.0000 mg | ORAL_TABLET | Freq: Two times a day (BID) | ORAL | 0 refills | Status: DC | PRN

## 2019-06-17 MED ORDER — MELOXICAM 7.5 MG PO TABS: 7.5000 mg | ORAL_TABLET | Freq: Every day | ORAL | 0 refills | Status: DC

## 2019-06-17 NOTE — Progress Notes (Signed)
Established Patient Office Visit  Subjective:  Patient ID: Matthew Wilkerson, male    DOB: 1946/02/27  Age: 74 y.o. MRN: [id number]  CC:  Chief Complaint  Patient presents with  . Leg Pain    pain in the back of left thigh since the beginning of the week when he start to walk it goes away but comes back the next morning    HPI Matthew Wilkerson presents for Left leg pain  Onset beginning of week - feels tight. Located in hamstring, occasionally radiates downwards but not much joint involvement. Slow onset. Usually worst in morning and improved within 10-15 minutes of movement.  Leg is not swollen, tender, red. No rash or skin changes.   Past Medical History:  Diagnosis Date  . Abnormal PSA 09/2011   7.63,   . Allergy   . BPH (benign prostatic hyperplasia)   . Elevated PSA   . Hernia   . Hyperlipidemia   . Hypertension   . Inguinal hernia    left   . Prostate CA ([place]) 01/08/12   bx=Adenocarcinoma, gleason 6 (3+3), prostate volume 43 cc  . Prostate cancer [facility]) dx 11/2010   prostate ca,PSA=7.2 T1c volume=30cc  . S/P radiation therapy within four to twelve weeks 08/23/12-10/18/12   prostate,7800cGy/40 sessions  . Urinary obstruction     Past Surgical History:  Procedure Laterality Date  . HERNIA REPAIR     right inguinal done in early 70's  . PROSTATE BIOPSY  01/08/12   Adenocarcinoma,gleason=3+3=6,& 3+4=7,volume=43cc     Family History  Problem Relation Age of Onset  . Cancer Sister 31       pancreatic    Social History   Socioeconomic History  . Marital status: Divorced    Spouse name: Not on file  . Number of children: 2  . Years of education: Not on file  . Highest education level: Not on file  Occupational History  . 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[employer]  Tobacco Use  . Smoking status: Never Smoker  . Smokeless tobacco: Never Used  Substance and Sexual Activity  . Alcohol use: No  . Drug  use: Yes    Types: MDMA (Ecstacy)  . Sexual activity: Yes  Other Topics Concern  . Not on file  Social History Narrative  . Not on file   Social Determinants of Health   Financial Resource Strain: Low Risk   . Difficulty of Paying Living Expenses: Not hard at all  Food Insecurity: No Food Insecurity  . Worried About Charity fundraiser in the Last Year: Never true  . Ran Out of Food in the Last Year: Never true  Transportation Needs: No Transportation Needs  . Lack of Transportation (Medical): No  . Lack of Transportation (Non-Medical): No  Physical Activity: Insufficiently Active  . Days of Exercise per Week: 3 days  . Minutes of Exercise per Session: 30 min  Stress: No Stress Concern Present  . Feeling of Stress : Not at all  Social Connections: Unknown  . Frequency of Communication with Friends and Family: Three times a week  . Frequency of Social Gatherings with Friends and Family: Twice a week  . Attends Religious Services: Patient refused  . Active Member of Clubs or Organizations: Patient refused  . Attends Archivist Meetings: Patient refused  . Marital Status: Divorced  Human resources officer Violence: Not At Risk  . Fear of Current or Ex-Partner: No  .  Emotionally Abused: No  . Physically Abused: No  . Sexually Abused: No    Outpatient Medications Prior to Visit  Medication Sig Dispense Refill  . aspirin EC 81 MG tablet Take 1 tablet (81 mg total) by mouth daily.    Marland Kitchen atorvastatin (LIPITOR) 20 MG tablet Take 1 tablet each evening. 90 tablet 1  . metoprolol succinate (TOPROL-XL) 100 MG 24 hr tablet Take 1 tablet each morning. 90 tablet 1  . mometasone (NASONEX) 50 MCG/ACT nasal spray Place 2 sprays into the nose daily. 17 g 2  . olmesartan-hydrochlorothiazide (BENICAR HCT) 40-25 MG tablet TAKE 1 TABLET BY MOUTH EACH MORNING 90 tablet 1   No facility-administered medications prior to visit.    Allergies  Allergen Reactions  . Aspirin Nausea Only    High  dose asp.... He tolerates 81 mg   . Codeine Other (See Comments)    Derivatives, "causes my sinuses to hurt"    ROS Review of Systems  Constitutional: Negative.   HENT: Negative.   Eyes: Negative.   Respiratory: Negative.   Cardiovascular: Negative.   Gastrointestinal: Negative.   Endocrine: Negative.   Genitourinary: Negative.   Musculoskeletal: Negative.   Skin: Negative.   Allergic/Immunologic: Negative.   Neurological: Negative.   Hematological: Negative.   Psychiatric/Behavioral: Negative.   All other systems reviewed and are negative.     Objective:    Physical Exam  Constitutional: He is oriented to person, place, and time. He appears well-developed and well-nourished. No distress.  Cardiovascular: Normal rate and regular rhythm.  Pulmonary/Chest: Effort normal. No respiratory distress.  Musculoskeletal:        General: No tenderness, deformity or edema. Normal range of motion.     Cervical back: Normal range of motion.  Neurological: He is oriented to person, place, and time.  Negative straight leg raise  Skin: Skin is warm and dry. No rash noted. He is not diaphoretic. No erythema. No pallor.  Psychiatric: He has a normal mood and affect. His behavior is normal. Judgment and thought content normal.  Nursing note and vitals reviewed.   BP (!) 153/89   Pulse 91   Temp 97.8 F (36.6 C) (Temporal)   Ht 5\' 7"  (1.702 m)   Wt 220 lb 12.8 oz (100.2 kg)   SpO2 97% Comment: 97  BMI 34.58 kg/m  Wt Readings from Last 3 Encounters:  06/17/19 220 lb 12.8 oz (100.2 kg)  12/07/18 215 lb (97.5 kg)  06/05/18 189 lb (85.7 kg)     Health Maintenance Due  Topic Date Due  . COLONOSCOPY  11/29/1993  . PNA vac Low Risk Adult (1 of 2 - PCV13) 11/29/2008  . INFLUENZA VACCINE  11/20/2018  . TETANUS/TDAP  04/22/2019    There are no preventive care reminders to display for this patient.  Lab Results  Component Value Date   TSH 5.350 (H) 12/07/2018   Lab Results    Component Value Date   WBC 6.6 12/07/2018   HGB 13.5 12/07/2018   HCT 41.5 12/07/2018   MCV 96 12/07/2018   PLT 151 12/07/2018   Lab Results  Component Value Date   NA 143 12/07/2018   K 4.0 12/07/2018   CO2 22 12/07/2018   GLUCOSE 86 12/07/2018   BUN 26 12/07/2018   CREATININE 1.65 (H) 12/07/2018   BILITOT 0.3 12/07/2018   ALKPHOS 103 12/07/2018   AST 28 12/07/2018   ALT 26 12/07/2018   PROT 7.2 12/07/2018   ALBUMIN 4.2 12/07/2018  CALCIUM 9.3 12/07/2018   Lab Results  Component Value Date   CHOL 144 12/07/2018   Lab Results  Component Value Date   HDL 40 12/07/2018   Lab Results  Component Value Date   LDLCALC 84 12/07/2018   Lab Results  Component Value Date   TRIG 98 12/07/2018   Lab Results  Component Value Date   CHOLHDL 3.6 12/07/2018   Lab Results  Component Value Date   HGBA1C 6.1 (H) 12/07/2018      Assessment & Plan:   Problem List Items Addressed This Visit    None    Visit Diagnoses    Hamstring muscle strain, left, initial encounter    -  Primary   Relevant Medications   meloxicam (MOBIC) 7.5 MG tablet   methocarbamol (ROBAXIN) 500 MG tablet      Meds ordered this encounter  Medications  . meloxicam (MOBIC) 7.5 MG tablet    Sig: Take 1 tablet (7.5 mg total) by mouth daily.    Dispense:  30 tablet    Refill:  0    Order Specific Question:   Supervising Provider    Answer:   Delia Chimes A [id number]  . methocarbamol (ROBAXIN) 500 MG tablet    Sig: Take 1 tablet (500 mg total) by mouth 2 (two) times daily as needed for muscle spasms.    Dispense:  30 tablet    Refill:  0    Order Specific Question:   Supervising Provider    Answer:   Forrest Moron [id number]    Follow-up: No follow-ups on file.   PLAN  Appears to be hamstring strain as there is no evidence of radiculopathy or DVT.   Will treat with meloxicam and methocarbamol   Demonstrated a variety of stretches to improve upon this  Given return  precautions  Patient encouraged to call clinic with any questions, comments, or concerns.  Maximiano Coss, NP

## 2019-06-17 NOTE — Patient Instructions (Addendum)
  Mr Matthew Wilkerson to see you again today. I believe we're dealing with a muscle strain in the left hamstring.  Please take the medications as follows:  Meloxicam: take 1 tablet (7.5 mg) first thing in the morning. Ideally, take this with water and something to eat. Do not take ibuprofen on days you are taking this medication  Methocarbamol: Take 1 tablet (500mg ) twice daily - ideally once around lunch time and once before bed. This is a muscle relaxer.   Neither of these medications will interact with your other current medications. There should not be any major side effects.   Additionally, do some hamstring, hip, and lower back stretches throughout the day as tolerated. Stretch both sides, even though your left side is the one that's currently affected. I have printed out some stretches for you to use.   Please hydrate very well with water and other clear liquids.    If you have lab work done today you will be contacted with your lab results within the next 2 weeks.  If you have not heard from [place] then please contact us. The fastest way to get your results is to register for My Chart.   IF you received an x-ray today, you will receive an invoice from [facility]. Please contact [facility] at [phone] with questions or concerns regarding your invoice.   IF you received labwork today, you will receive an invoice from [place]. Please contact [facility] at [phone] with questions or concerns regarding your invoice.   Our billing staff will not be able to assist you with questions regarding bills from these companies.  You will be contacted with the lab results as soon as they are available. The fastest way to get your results is to activate your My Chart account. Instructions are located on the last page of this paperwork. If you have not heard from [place] regarding the results in 2 weeks, please contact this office.

## 2019-07-01 ENCOUNTER — Telehealth: Payer: Self-pay | Admitting: Registered Nurse

## 2019-07-01 NOTE — Telephone Encounter (Signed)
Pt wants to know if he should remain on the methocarbamol (ROBAXIN) 500 MG tablet.  States that he only has two left.

## 2019-07-01 NOTE — Telephone Encounter (Signed)
If he's feeling better, he may stop this medication  Kathrin Ruddy, NP

## 2019-07-04 NOTE — Telephone Encounter (Signed)
Please Advise

## 2019-07-04 NOTE — Telephone Encounter (Signed)
Pt called regarding the medication for his back left leg pain. He is out of the  methocarbamol (ROBAXIN) 500 MG tablet [id number]  Pt is taking the meloxicam (MOBIC) 7.5 MG tablet [id number] in the moring there is a little pain in the morning but goes away after about 10 mins. Pt did not experience any pain on both medications. Pt would like provider to call to see if he should get just keep taking the Meloxicam or if she should get the other medication prescribed again. [phone] Please advise.

## 2019-07-04 NOTE — Telephone Encounter (Signed)
He should keep taking the meloxicam and stretching 2-3 times daily for best relief. Should his pain change, worsen, or should he have concerns, he may contact us  Thank you  Kathrin Ruddy, NP

## 2019-07-04 NOTE — Telephone Encounter (Signed)
Called and informed patient he stated if it gets worse he will return the call.

## 2019-07-07 ENCOUNTER — Other Ambulatory Visit: Payer: Self-pay | Admitting: Registered Nurse

## 2019-07-07 DIAGNOSIS — I10 Essential (primary) hypertension: Secondary | ICD-10-CM

## 2019-07-08 ENCOUNTER — Ambulatory Visit (INDEPENDENT_AMBULATORY_CARE_PROVIDER_SITE_OTHER): Payer: Medicare Other | Admitting: Registered Nurse

## 2019-07-08 ENCOUNTER — Encounter: Payer: Self-pay | Admitting: Registered Nurse

## 2019-07-08 ENCOUNTER — Other Ambulatory Visit: Payer: Self-pay

## 2019-07-08 DIAGNOSIS — I10 Essential (primary) hypertension: Secondary | ICD-10-CM

## 2019-07-08 DIAGNOSIS — S76312D Strain of muscle, fascia and tendon of the posterior muscle group at thigh level, left thigh, subsequent encounter: Secondary | ICD-10-CM | POA: Diagnosis not present

## 2019-07-08 DIAGNOSIS — E7800 Pure hypercholesterolemia, unspecified: Secondary | ICD-10-CM

## 2019-07-08 DIAGNOSIS — S76312A Strain of muscle, fascia and tendon of the posterior muscle group at thigh level, left thigh, initial encounter: Secondary | ICD-10-CM

## 2019-07-08 MED ORDER — ATORVASTATIN CALCIUM 20 MG PO TABS: ORAL_TABLET | ORAL | 1 refills | Status: DC

## 2019-07-08 MED ORDER — METHOCARBAMOL 500 MG PO TABS: 500.0000 mg | ORAL_TABLET | Freq: Two times a day (BID) | ORAL | 0 refills | Status: DC | PRN

## 2019-07-08 MED ORDER — OLMESARTAN MEDOXOMIL-HCTZ 40-25 MG PO TABS: ORAL_TABLET | ORAL | 1 refills | Status: DC

## 2019-07-08 MED ORDER — MELOXICAM 7.5 MG PO TABS: 7.5000 mg | ORAL_TABLET | Freq: Every day | ORAL | 0 refills | Status: DC

## 2019-07-08 MED ORDER — METOPROLOL SUCCINATE ER 100 MG PO TB24: 100.0000 mg | ORAL_TABLET | Freq: Two times a day (BID) | ORAL | 1 refills | Status: DC

## 2019-07-08 NOTE — Progress Notes (Signed)
Established Patient Office Visit  Subjective:  Patient ID: Matthew Wilkerson, male    DOB: Apr 18, 1946  Age: 74 y.o. MRN: [id number]  CC:  Chief Complaint  Patient presents with  . Follow-up    patient was here 06/17/2019 for leg pain states he with the muscle relaxer and still have some pain , but it goes away faster with the medication . Also to check up on Blood pressure .    HPI Matthew Wilkerson presents for HTN recheck  Feeling well overall, no CV symptoms. Reports BP has been generally good  Otherwise, notes that leg pain has improved, but not resolved. Still worst on waking, but resolved soon after. Has been taking meloxicam and methocarbamol as needed without poor effect. Has been stretching intermittently. Interested in refills - he's hoping to stretch more frequently and resolve this pain, declines PT referral at this time.  Past Medical History:  Diagnosis Date  . Abnormal PSA 09/2011   7.63,   . Allergy   . BPH (benign prostatic hyperplasia)   . Elevated PSA   . Hernia   . Hyperlipidemia   . Hypertension   . Inguinal hernia    left   . Prostate CA ([place]) 01/08/12   bx=Adenocarcinoma, gleason 6 (3+3), prostate volume 43 cc  . Prostate cancer [facility]) dx 11/2010   prostate ca,PSA=7.2 T1c volume=30cc  . S/P radiation therapy within four to twelve weeks 08/23/12-10/18/12   prostate,7800cGy/40 sessions  . Urinary obstruction     Past Surgical History:  Procedure Laterality Date  . HERNIA REPAIR     right inguinal done in early 70's  . PROSTATE BIOPSY  01/08/12   Adenocarcinoma,gleason=3+3=6,& 3+4=7,volume=43cc     Family History  Problem Relation Age of Onset  . Cancer Sister 24       pancreatic    Social History   Socioeconomic History  . Marital status: Divorced    Spouse name: Not on file  . Number of children: 2  . Years of education: Not on file  . Highest education level: Not on file  Occupational History  . Occupation: Retired    Comment: Public relations account executive [employer]  Tobacco Use  . Smoking status: Never Smoker  . Smokeless tobacco: Never Used  Substance and Sexual Activity  . Alcohol use: No  . Drug use: Yes    Types: MDMA (Ecstacy)  . Sexual activity: Yes  Other Topics Concern  . Not on file  Social History Narrative  . Not on file   Social Determinants of Health   Financial Resource Strain: Low Risk   . Difficulty of Paying Living Expenses: Not hard at all  Food Insecurity: No Food Insecurity  . Worried About Charity fundraiser in the Last Year: Never true  . Ran Out of Food in the Last Year: Never true  Transportation Needs: No Transportation Needs  . Lack of Transportation (Medical): No  . Lack of Transportation (Non-Medical): No  Physical Activity: Insufficiently Active  . Days of Exercise per Week: 3 days  . Minutes of Exercise per Session: 30 min  Stress: No Stress Concern Present  . Feeling of Stress : Not at all  Social Connections: Unknown  . Frequency of Communication with Friends and Family: Three times a week  . Frequency of Social Gatherings with Friends and Family: Twice a week  . Attends Religious Services: Patient refused  . Active Member of Clubs or Organizations: Patient refused  .  Attends Archivist Meetings: Patient refused  . Marital Status: Divorced  Human resources officer Violence: Not At Risk  . Fear of Current or Ex-Partner: No  . Emotionally Abused: No  . Physically Abused: No  . Sexually Abused: No    Outpatient Medications Prior to Visit  Medication Sig Dispense Refill  . aspirin EC 81 MG tablet Take 1 tablet (81 mg total) by mouth daily.    . mometasone (NASONEX) 50 MCG/ACT nasal spray Place 2 sprays into the nose daily. 17 g 2  . atorvastatin (LIPITOR) 20 MG tablet Take 1 tablet each evening. 90 tablet 1  . meloxicam (MOBIC) 7.5 MG tablet Take 1 tablet (7.5 mg total) by mouth daily. 30 tablet 0  . methocarbamol (ROBAXIN) 500 MG  tablet Take 1 tablet (500 mg total) by mouth 2 (two) times daily as needed for muscle spasms. 30 tablet 0  . metoprolol succinate (TOPROL-XL) 100 MG 24 hr tablet TAKE 1 TABLET BY MOUTH EACH MORNING 90 tablet 0  . olmesartan-hydrochlorothiazide (BENICAR HCT) 40-25 MG tablet TAKE 1 TABLET BY MOUTH EACH MORNING 90 tablet 1   No facility-administered medications prior to visit.    Allergies  Allergen Reactions  . Aspirin Nausea Only    High dose asp.... He tolerates 81 mg   . Codeine Other (See Comments)    Derivatives, "causes my sinuses to hurt"    ROS Review of Systems  Constitutional: Negative.   HENT: Negative.   Eyes: Negative.   Respiratory: Negative.   Cardiovascular: Negative.   Gastrointestinal: Negative.   Endocrine: Negative.   Genitourinary: Negative.   Musculoskeletal: Positive for myalgias (hamstring strain, improving). Negative for arthralgias, back pain, gait problem, joint swelling, neck pain and neck stiffness.  Skin: Negative.   Allergic/Immunologic: Negative.   Neurological: Negative.   Hematological: Negative.   Psychiatric/Behavioral: Negative.   All other systems reviewed and are negative.     Objective:    Physical Exam  Constitutional: He is oriented to person, place, and time. He appears well-developed and well-nourished. No distress.  Cardiovascular: Normal rate, regular rhythm, normal heart sounds and intact distal pulses. Exam reveals no gallop and no friction rub.  No murmur heard. Pulmonary/Chest: Effort normal and breath sounds normal. No respiratory distress. He has no wheezes. He has no rales. He exhibits no tenderness.  Neurological: He is alert and oriented to person, place, and time.  Skin: Skin is warm and dry. No rash noted. He is not diaphoretic. No erythema. No pallor.  Psychiatric: He has a normal mood and affect. His behavior is normal. Judgment and thought content normal.  Nursing note and vitals reviewed.   BP (!) 143/78    Pulse 88   Temp 98 F (36.7 C) (Temporal)   Ht 5\' 7"  (1.702 m)   Wt 221 lb 3.2 oz (100.3 kg)   SpO2 98%   BMI 34.64 kg/m  Wt Readings from Last 3 Encounters:  07/08/19 221 lb 3.2 oz (100.3 kg)  06/17/19 220 lb 12.8 oz (100.2 kg)  12/07/18 215 lb (97.5 kg)     Health Maintenance Due  Topic Date Due  . COLONOSCOPY  Never done  . PNA vac Low Risk Adult (1 of 2 - PCV13) Never done  . INFLUENZA VACCINE  Never done  . TETANUS/TDAP  04/22/2019    There are no preventive care reminders to display for this patient.  Lab Results  Component Value Date   TSH 5.350 (H) 12/07/2018   Lab Results  Component Value Date   WBC 6.6 12/07/2018   HGB 13.5 12/07/2018   HCT 41.5 12/07/2018   MCV 96 12/07/2018   PLT 151 12/07/2018   Lab Results  Component Value Date   NA 143 12/07/2018   K 4.0 12/07/2018   CO2 22 12/07/2018   GLUCOSE 86 12/07/2018   BUN 26 12/07/2018   CREATININE 1.65 (H) 12/07/2018   BILITOT 0.3 12/07/2018   ALKPHOS 103 12/07/2018   AST 28 12/07/2018   ALT 26 12/07/2018   PROT 7.2 12/07/2018   ALBUMIN 4.2 12/07/2018   CALCIUM 9.3 12/07/2018   Lab Results  Component Value Date   CHOL 144 12/07/2018   Lab Results  Component Value Date   HDL 40 12/07/2018   Lab Results  Component Value Date   LDLCALC 84 12/07/2018   Lab Results  Component Value Date   TRIG 98 12/07/2018   Lab Results  Component Value Date   CHOLHDL 3.6 12/07/2018   Lab Results  Component Value Date   HGBA1C 6.1 (H) 12/07/2018      Assessment & Plan:   Problem List Items Addressed This Visit      Cardiovascular and Mediastinum   Essential hypertension   Relevant Medications   metoprolol succinate (TOPROL-XL) 100 MG 24 hr tablet   olmesartan-hydrochlorothiazide (BENICAR HCT) 40-25 MG tablet   atorvastatin (LIPITOR) 20 MG tablet     Other   Pure hypercholesterolemia   Relevant Medications   metoprolol succinate (TOPROL-XL) 100 MG 24 hr tablet    olmesartan-hydrochlorothiazide (BENICAR HCT) 40-25 MG tablet   atorvastatin (LIPITOR) 20 MG tablet    Other Visit Diagnoses    Hamstring muscle strain, left, initial encounter       Relevant Medications   methocarbamol (ROBAXIN) 500 MG tablet   meloxicam (MOBIC) 7.5 MG tablet      Meds ordered this encounter  Medications  . metoprolol succinate (TOPROL-XL) 100 MG 24 hr tablet    Sig: Take 1 tablet (100 mg total) by mouth in the morning and at bedtime. TAKE 1 TABLET BY MOUTH EACH MORNING    Dispense:  180 tablet    Refill:  1    Order Specific Question:   Supervising Provider    Answer:   Delia Chimes A [id number]  . methocarbamol (ROBAXIN) 500 MG tablet    Sig: Take 1 tablet (500 mg total) by mouth 2 (two) times daily as needed for muscle spasms.    Dispense:  30 tablet    Refill:  0    Order Specific Question:   Supervising Provider    Answer:   Delia Chimes A [id number]  . meloxicam (MOBIC) 7.5 MG tablet    Sig: Take 1 tablet (7.5 mg total) by mouth daily.    Dispense:  30 tablet    Refill:  0    Order Specific Question:   Supervising Provider    Answer:   Delia Chimes A [id number]  . olmesartan-hydrochlorothiazide (BENICAR HCT) 40-25 MG tablet    Sig: TAKE 1 TABLET BY MOUTH EACH MORNING    Dispense:  90 tablet    Refill:  1    DX Code Needed  .    Order Specific Question:   Supervising Provider    Answer:   Forrest Moron [id number]  . atorvastatin (LIPITOR) 20 MG tablet    Sig: Take 1 tablet each evening.    Dispense:  90 tablet    Refill:  1    Office  visit needed for future refills    Order Specific Question:   Supervising Provider    Answer:   Forrest Moron [id number]    Follow-up: No follow-ups on file.   PLAN  BP borderline. Will increase metoprolol to twice daily.  Methocarbamol and meloxicam refilled - encouraged patient to use infrequently and increase stretching  Return in 6 mo for labs and bp check  Patient encouraged to call clinic with  any questions, comments, or concerns.  Maximiano Coss, NP

## 2019-07-08 NOTE — Patient Instructions (Signed)
° ° ° °  If you have lab work done today you will be contacted with your lab results within the next 2 weeks.  If you have not heard from us then please contact us. The fastest way to get your results is to register for My Chart. ° ° °IF you received an x-ray today, you will receive an invoice from [facility]. Please contact [facility] at [phone] with questions or concerns regarding your invoice.  ° °IF you received labwork today, you will receive an invoice from [facility]. Please contact [facility] at [phone] with questions or concerns regarding your invoice.  ° °Our billing staff will not be able to assist you with questions regarding bills from these companies. ° °You will be contacted with the lab results as soon as they are available. The fastest way to get your results is to activate your My Chart account. Instructions are located on the last page of this paperwork. If you have not heard from us regarding the results in 2 weeks, please contact this office. °  ° ° ° °

## 2019-07-25 ENCOUNTER — Ambulatory Visit (HOSPITAL_COMMUNITY)
Admission: EM | Admit: 2019-07-25 | Discharge: 2019-07-25 | Disposition: A | Discharge status: Home / Self Care | Payer: Medicare Other

## 2019-07-25 ENCOUNTER — Encounter (HOSPITAL_COMMUNITY): Payer: Self-pay | Admitting: Family Medicine

## 2019-07-25 ENCOUNTER — Other Ambulatory Visit: Payer: Self-pay

## 2019-07-25 DIAGNOSIS — M79605 Pain in left leg: Secondary | ICD-10-CM | POA: Diagnosis not present

## 2019-07-25 NOTE — Discharge Instructions (Addendum)
Start back taking the meloxicam daily. Continue using heat, stretching and you can do Ace wrap for compression. Contact given for sports medicine follow-up

## 2019-07-25 NOTE — ED Triage Notes (Signed)
Pt c/o left upper posterior leg pain since Feb 5th. Pt states was seen at Primary care at [facility] and was given muscle relaxers, but leg is not better. Pt states once he starts walking around the pain goes away. Pt denies injury. Pt states he tried the stretches they gave him, but it doesn't help. Pt walked to exam room well.

## 2019-07-26 NOTE — ED Provider Notes (Signed)
[place]    CSN: [id number] Arrival date & time: 07/25/19  [id number]      History   Chief Complaint No chief complaint on file.   HPI Matthew Wilkerson is a 74 y.o. male.   Patient is a 74 year old male who presents today with left posterior thigh pain.  This is been present, waxing waning over the past 2 months.  He was seen at primary care at [facility] and diagnosed with hamstring strain.  Was given muscle relaxers and taking NSAIDs but the pain is not improved.  The pain is worse when waking up in the morning and then improves throughout the day.  No swelling, bruising to the leg.  No injuries.  He is also been trying stretches.  No fevers. No calf pain, swelling.  ROS per HPI      Past Medical History:  Diagnosis Date  . Abnormal PSA 09/2011   7.63,   . Allergy   . BPH (benign prostatic hyperplasia)   . Elevated PSA   . Hernia   . Hyperlipidemia   . Hypertension   . Inguinal hernia    left   . Prostate CA ([place]) 01/08/12   bx=Adenocarcinoma, gleason 6 (3+3), prostate volume 43 cc  . Prostate cancer [facility]) dx 11/2010   prostate ca,PSA=7.2 T1c volume=30cc  . S/P radiation therapy within four to twelve weeks 08/23/12-10/18/12   prostate,7800cGy/40 sessions  . Urinary obstruction     Patient Active Problem List   Diagnosis Date Noted  . Pure hypercholesterolemia 08/31/2018  . Acute nonintractable headache 03/19/2017  . Sinus pressure 03/19/2017  . Class 1 obesity due to excess calories with serious comorbidity and body mass index (BMI) of 32.0 to 32.9 in adult 12/14/2016  . Hyperlipidemia 08/17/2012  . Headache 08/17/2012  . Prostate cancer ([place]) 01/08/2012  . Essential hypertension 08/01/2011  . Left inguinal hernia 11/20/2010    Past Surgical History:  Procedure Laterality Date  . HERNIA REPAIR     right inguinal done in early 70's  . PROSTATE BIOPSY  01/08/12   Adenocarcinoma,gleason=3+3=6,& 3+4=7,volume=43cc        Home Medications    Prior to  Admission medications   Medication Sig Start Date End Date Taking? Authorizing Provider  aspirin EC 81 MG tablet Take 1 tablet (81 mg total) by mouth daily. 04/25/17   Shawnee Knapp, MD  atorvastatin (LIPITOR) 20 MG tablet Take 1 tablet each evening. 07/08/19   Maximiano Coss, NP  meloxicam (MOBIC) 7.5 MG tablet Take 1 tablet (7.5 mg total) by mouth daily. 07/08/19   Maximiano Coss, NP  methocarbamol (ROBAXIN) 500 MG tablet Take 1 tablet (500 mg total) by mouth 2 (two) times daily as needed for muscle spasms. 07/08/19   Maximiano Coss, NP  metoprolol succinate (TOPROL-XL) 100 MG 24 hr tablet Take 1 tablet (100 mg total) by mouth in the morning and at bedtime. TAKE 1 TABLET BY MOUTH EACH MORNING 07/08/19   Maximiano Coss, NP  mometasone (NASONEX) 50 MCG/ACT nasal spray Place 2 sprays into the nose daily. 04/25/17   Shawnee Knapp, MD  olmesartan-hydrochlorothiazide [facility] HCT) 40-25 MG tablet TAKE 1 TABLET BY MOUTH EACH MORNING 07/08/19   Maximiano Coss, NP    Family History Family History  Problem Relation Age of Onset  . Cancer Sister 67       pancreatic    Social History Social History   Tobacco Use  . Smoking status: Never Smoker  . Smokeless tobacco: Never Used  Substance Use Topics  . Alcohol use: No  . Drug use: Never    Types: MDMA (Ecstacy)     Allergies   Aspirin and Codeine   Review of Systems Review of Systems   Physical Exam Triage Vital Signs ED Triage Vitals  Enc Vitals Group     BP 07/25/19 0903 (!) 152/90     Pulse Rate 07/25/19 0903 86     Resp 07/25/19 0903 18     Temp 07/25/19 0903 97.6 F (36.4 C)     Temp Source 07/25/19 0903 Oral     SpO2 07/25/19 0903 96 %     Weight 07/25/19 0904 210 lb (95.3 kg)     Height 07/25/19 0904 5\' 8"  (1.727 m)     Head Circumference --      Peak Flow --      Pain Score 07/25/19 0904 6     Pain Loc --      Pain Edu? --      Excl. in [place]? --    No data found.  Updated Vital Signs BP (!) 152/90   Pulse 86   Temp  97.6 F (36.4 C) (Oral)   Resp 18   Ht 5\' 8"  (1.727 m)   Wt 210 lb (95.3 kg)   SpO2 96%   BMI 31.93 kg/m   Visual Acuity Right Eye Distance:   Left Eye Distance:   Bilateral Distance:    Right Eye Near:   Left Eye Near:    Bilateral Near:     Physical Exam Vitals and nursing note reviewed.  Constitutional:      Appearance: Normal appearance.  HENT:     Head: Normocephalic and atraumatic.     Nose: Nose normal.  Eyes:     Conjunctiva/sclera: Conjunctivae normal.  Pulmonary:     Effort: Pulmonary effort is normal.  Musculoskeletal:        General: Normal range of motion.     Cervical back: Normal range of motion.  Skin:    General: Skin is warm and dry.  Neurological:     Mental Status: He is alert.  Psychiatric:        Mood and Affect: Mood normal.      UC Treatments / Results  Labs (all labs ordered are listed, but only abnormal results are displayed) Labs Reviewed - No data to display  EKG   Radiology No results found.  Procedures Procedures (including critical care time)  Medications Ordered in UC Medications - No data to display  Initial Impression / Assessment and Plan / UC Course  I have reviewed the triage vital signs and the nursing notes.  Pertinent labs & imaging results that were available during my care of the patient were reviewed by me and considered in my medical decision making (see chart for details).     Leg pain-most likely related to hamstring strain or may be possible small tear.  There is no bruising, swelling or deformities to the posterior leg.  There is no calf pain or swelling. No concern for blood clot today. Recommend start back taking the meloxicam daily.  Applying heat, Ace wrap and doing gentle stretching Contact given for sports medicine follow-up for any continued or worsening problems  Final Clinical Impressions(s) / UC Diagnoses   Final diagnoses:  Leg pain, posterior, left     Discharge Instructions       Start back taking the meloxicam daily. Continue using heat, stretching and you can do  Ace wrap for compression. Contact given for sports medicine follow-up     ED Prescriptions    None     PDMP not reviewed this encounter.   Loura Halt A, NP 07/26/19 1553

## 2020-01-06 ENCOUNTER — Ambulatory Visit: Payer: Medicare Other | Admitting: Registered Nurse

## 2020-01-22 ENCOUNTER — Encounter (HOSPITAL_COMMUNITY): Payer: Self-pay | Admitting: Emergency Medicine

## 2020-01-22 ENCOUNTER — Ambulatory Visit (INDEPENDENT_AMBULATORY_CARE_PROVIDER_SITE_OTHER): Payer: Medicare Other

## 2020-01-22 ENCOUNTER — Ambulatory Visit (HOSPITAL_COMMUNITY)
Admission: EM | Admit: 2020-01-22 | Discharge: 2020-01-22 | Disposition: A | Discharge status: Home / Self Care | Payer: Medicare Other | Attending: Emergency Medicine | Admitting: Emergency Medicine

## 2020-01-22 ENCOUNTER — Other Ambulatory Visit: Payer: Self-pay

## 2020-01-22 DIAGNOSIS — S6992XA Unspecified injury of left wrist, hand and finger(s), initial encounter: Secondary | ICD-10-CM

## 2020-01-22 DIAGNOSIS — S62639A Displaced fracture of distal phalanx of unspecified finger, initial encounter for closed fracture: Secondary | ICD-10-CM

## 2020-01-22 DIAGNOSIS — M7989 Other specified soft tissue disorders: Secondary | ICD-10-CM

## 2020-01-22 MED ORDER — NAPROXEN 375 MG PO TABS: 375.0000 mg | ORAL_TABLET | Freq: Two times a day (BID) | ORAL | 0 refills | Status: DC

## 2020-01-22 NOTE — Discharge Instructions (Addendum)
There is a small fracture near this joint Ibuprofen and Tylenol for pain or naprosyn Follow-up with orthopedics Wear splint to immobilize finger Ice

## 2020-01-22 NOTE — ED Provider Notes (Signed)
[place]    CSN: [id number] Arrival date & time: 01/22/20  1002      History   Chief Complaint Chief Complaint  Patient presents with  . Finger Injury    Middle    HPI Matthew Wilkerson is a 74 y.o. male presenting today for evaluation of left middle finger injury.  Approximately 10 days ago a window fell on his finger.  Reports pain to his DIP.  Has also developed deformity at rest to his finger.  Denies prior injury.  HPI  Past Medical History:  Diagnosis Date  . Abnormal PSA 09/2011   7.63,   . Allergy   . BPH (benign prostatic hyperplasia)   . Elevated PSA   . Hernia   . Hyperlipidemia   . Hypertension   . Inguinal hernia    left   . Prostate CA ([place]) 01/08/12   bx=Adenocarcinoma, gleason 6 (3+3), prostate volume 43 cc  . Prostate cancer [facility]) dx 11/2010   prostate ca,PSA=7.2 T1c volume=30cc  . S/P radiation therapy within four to twelve weeks 08/23/12-10/18/12   prostate,7800cGy/40 sessions  . Urinary obstruction     Patient Active Problem List   Diagnosis Date Noted  . Pure hypercholesterolemia 08/31/2018  . Acute nonintractable headache 03/19/2017  . Sinus pressure 03/19/2017  . Class 1 obesity due to excess calories with serious comorbidity and body mass index (BMI) of 32.0 to 32.9 in adult 12/14/2016  . Hyperlipidemia 08/17/2012  . Headache 08/17/2012  . Prostate cancer ([place]) 01/08/2012  . Essential hypertension 08/01/2011  . Left inguinal hernia 11/20/2010    Past Surgical History:  Procedure Laterality Date  . HERNIA REPAIR     right inguinal done in early 70's  . PROSTATE BIOPSY  01/08/12   Adenocarcinoma,gleason=3+3=6,& 3+4=7,volume=43cc        Home Medications    Prior to Admission medications   Medication Sig Start Date End Date Taking? Authorizing Provider  aspirin EC 81 MG tablet Take 1 tablet (81 mg total) by mouth daily. 04/25/17   Shawnee Knapp, MD  atorvastatin (LIPITOR) 20 MG tablet Take 1 tablet each evening. 07/08/19    Maximiano Coss, NP  meloxicam (MOBIC) 7.5 MG tablet Take 1 tablet (7.5 mg total) by mouth daily. 07/08/19   Maximiano Coss, NP  methocarbamol (ROBAXIN) 500 MG tablet Take 1 tablet (500 mg total) by mouth 2 (two) times daily as needed for muscle spasms. 07/08/19   Maximiano Coss, NP  metoprolol succinate (TOPROL-XL) 100 MG 24 hr tablet Take 1 tablet (100 mg total) by mouth in the morning and at bedtime. TAKE 1 TABLET BY MOUTH EACH MORNING 07/08/19   Maximiano Coss, NP  mometasone (NASONEX) 50 MCG/ACT nasal spray Place 2 sprays into the nose daily. 04/25/17   Shawnee Knapp, MD  naproxen (NAPROSYN) 375 MG tablet Take 1 tablet (375 mg total) by mouth 2 (two) times daily. 01/22/20   Sarabelle Genson C, PA-C  olmesartan-hydrochlorothiazide (BENICAR HCT) 40-25 MG tablet TAKE 1 TABLET BY MOUTH EACH MORNING 07/08/19   Maximiano Coss, NP    Family History Family History  Problem Relation Age of Onset  . Cancer Sister 18       pancreatic    Social History Social History   Tobacco Use  . Smoking status: Never Smoker  . Smokeless tobacco: Never Used  Vaping Use  . Vaping Use: Never used  Substance Use Topics  . Alcohol use: No  . Drug use: Never    Types:  MDMA (Ecstacy)     Allergies   Aspirin and Codeine   Review of Systems Review of Systems  Constitutional: Negative for fatigue and fever.  Eyes: Negative for redness, itching and visual disturbance.  Respiratory: Negative for shortness of breath.   Cardiovascular: Negative for chest pain and leg swelling.  Gastrointestinal: Negative for nausea and vomiting.  Musculoskeletal: Positive for arthralgias. Negative for myalgias.  Skin: Negative for color change, rash and wound.  Neurological: Negative for dizziness, syncope, weakness, light-headedness and headaches.     Physical Exam Triage Vital Signs ED Triage Vitals  Enc Vitals Group     BP 01/22/20 1015 (!) 149/96     Pulse Rate 01/22/20 1015 85     Resp 01/22/20 1015 16     Temp  01/22/20 1015 98 F (36.7 C)     Temp Source 01/22/20 1015 Oral     SpO2 01/22/20 1015 96 %     Weight --      Height --      Head Circumference --      Peak Flow --      Pain Score 01/22/20 1011 5     Pain Loc --      Pain Edu? --      Excl. in [place]? --    No data found.  Updated Vital Signs BP (!) 149/96 (BP Location: Left Arm)   Pulse 85   Temp 98 F (36.7 C) (Oral)   Resp 16   SpO2 96%   Visual Acuity Right Eye Distance:   Left Eye Distance:   Bilateral Distance:    Right Eye Near:   Left Eye Near:    Bilateral Near:     Physical Exam Vitals and nursing note reviewed.  Constitutional:      Appearance: He is well-developed.     Comments: No acute distress  HENT:     Head: Normocephalic and atraumatic.     Nose: Nose normal.  Eyes:     Conjunctiva/sclera: Conjunctivae normal.  Cardiovascular:     Rate and Rhythm: Normal rate.  Pulmonary:     Effort: Pulmonary effort is normal. No respiratory distress.  Abdominal:     General: There is no distension.  Musculoskeletal:        General: Normal range of motion.     Cervical back: Neck supple.     Comments: Left middle finger with mild swan-neck deformity noted to DIP and PIP; tender and mild erythema to DIP Full active range of motion  Skin:    General: Skin is warm and dry.  Neurological:     Mental Status: He is alert and oriented to person, place, and time.      UC Treatments / Results  Labs (all labs ordered are listed, but only abnormal results are displayed) Labs Reviewed - No data to display  EKG   Radiology DG Finger Middle Left  Result Date: 01/22/2020 CLINICAL DATA:  Approx 10 days ago pt states that he was raising a window when the safety did not catch and fell on his left middle finger. No open wound. Pt states that fingers appears to be swollen. Pt did use a warm compress when injury occurred. EXAM: LEFT MIDDLE FINGER 2+V COMPARISON:  Left hand radiographs 06/21/2016 FINDINGS: There is a  small bone fragment adjacent to the DIP joint best seen on the lateral view and a small amount of cortical irregularity at the base of the distal phalanx suspicious for a small  avulsion fracture. No other acute osseous abnormality. Regional soft tissues are unremarkable. IMPRESSION: Small bone fragment adjacent to the DIP joint in the middle finger with cortical irregularity at the base of the distal phalanx suspicious for a small avulsion fracture. Electronically Signed   By: Audie Pinto M.D.   On: 01/22/2020 10:54    Procedures Procedures (including critical care time)  Medications Ordered in UC Medications - No data to display  Initial Impression / Assessment and Plan / UC Course  I have reviewed the triage vital signs and the nursing notes.  Pertinent labs & imaging results that were available during my care of the patient were reviewed by me and considered in my medical decision making (see chart for details).     Avulsion fracture noted to distal phalanx at DIP.  Placing an static splint, follow-up with orthopedics.  Anti-inflammatories ice immobilization.  Discussed strict return precautions. Patient verbalized understanding and is agreeable with plan.  Final Clinical Impressions(s) / UC Diagnoses   Final diagnoses:  Closed avulsion fracture of distal phalanx of finger, initial encounter     Discharge Instructions     There is a small fracture near this joint Ibuprofen and Tylenol for pain or naprosyn Follow-up with orthopedics Wear splint to immobilize finger Ice    ED Prescriptions    Medication Sig Dispense Auth. Provider   naproxen (NAPROSYN) 375 MG tablet Take 1 tablet (375 mg total) by mouth 2 (two) times daily. 30 tablet Claris Guymon, [place] C, PA-C     PDMP not reviewed this encounter.   Joneen Caraway [place] C, PA-C 01/22/20 1125

## 2020-01-22 NOTE — ED Triage Notes (Signed)
Approx 10 days ago pt states that he was raising a window when the safety did not catch and fell on his left middle finger. No open wound. Pt states that fingers appears to be swollen. Pt did use a warm compress when injury occurred.

## 2020-01-25 ENCOUNTER — Ambulatory Visit: Payer: Medicare Other | Admitting: Registered Nurse

## 2020-02-07 ENCOUNTER — Ambulatory Visit (INDEPENDENT_AMBULATORY_CARE_PROVIDER_SITE_OTHER): Payer: Medicare Other | Admitting: Registered Nurse

## 2020-02-07 ENCOUNTER — Encounter: Payer: Self-pay | Admitting: Registered Nurse

## 2020-02-07 ENCOUNTER — Other Ambulatory Visit: Payer: Self-pay

## 2020-02-07 VITALS — BP 152/90 | HR 85 | Temp 98.0°F | Resp 18 | Ht 68.0 in | Wt 213.8 lb

## 2020-02-07 DIAGNOSIS — M79604 Pain in right leg: Secondary | ICD-10-CM

## 2020-02-07 NOTE — Patient Instructions (Signed)
° ° ° °  If you have lab work done today you will be contacted with your lab results within the next 2 weeks.  If you have not heard from us then please contact us. The fastest way to get your results is to register for My Chart. ° ° °IF you received an x-ray today, you will receive an invoice from [facility]. Please contact [facility] at [phone] with questions or concerns regarding your invoice.  ° °IF you received labwork today, you will receive an invoice from [facility]. Please contact [facility] at [phone] with questions or concerns regarding your invoice.  ° °Our billing staff will not be able to assist you with questions regarding bills from these companies. ° °You will be contacted with the lab results as soon as they are available. The fastest way to get your results is to activate your My Chart account. Instructions are located on the last page of this paperwork. If you have not heard from us regarding the results in 2 weeks, please contact this office. °  ° ° ° °

## 2020-02-08 ENCOUNTER — Telehealth: Payer: Self-pay | Admitting: Registered Nurse

## 2020-02-08 NOTE — Telephone Encounter (Signed)
Pt called in threw the [facility]. Pt is aware that he will be getting a call from a referral that he was seen for from provider yesterday 02/07/20. The referral is not in pts chart yet. Please advise.

## 2020-02-09 NOTE — Telephone Encounter (Signed)
Pt waiting on referral from 02/07/2020 appt.

## 2020-03-09 ENCOUNTER — Other Ambulatory Visit: Payer: Self-pay | Admitting: Registered Nurse

## 2020-03-09 DIAGNOSIS — E7800 Pure hypercholesterolemia, unspecified: Secondary | ICD-10-CM

## 2020-03-22 ENCOUNTER — Other Ambulatory Visit: Payer: Self-pay | Admitting: Registered Nurse

## 2020-03-22 DIAGNOSIS — I10 Essential (primary) hypertension: Secondary | ICD-10-CM

## 2020-04-08 ENCOUNTER — Encounter: Payer: Self-pay | Admitting: Registered Nurse

## 2020-04-08 NOTE — Progress Notes (Signed)
Acute Office Visit  Subjective:    Patient ID: Matthew Wilkerson, male    DOB: 1945/11/25, 74 y.o.   MRN: [id number]  Chief Complaint  Patient presents with   Leg Pain    patient states he has been having some left leg muscle pain for about one week. Per patient he has been using a gel rubb but still having some pain.    HPI Patient is in today for leg pain  Ongoing for one week No acute injury Has been using topical - unsure of name Limited relief Has been resting which has given some relief No swelling, redness, tenderness Pain is aching Muscles worse than joints No other symptoms.   Past Medical History:  Diagnosis Date   Abnormal PSA 09/2011   7.63,    Allergy    BPH (benign prostatic hyperplasia)    Elevated PSA    Hernia    Hyperlipidemia    Hypertension    Inguinal hernia    left    Prostate CA ([place]) 01/08/12   bx=Adenocarcinoma, gleason 6 (3+3), prostate volume 43 cc   Prostate cancer ([place]) dx 11/2010   prostate ca,PSA=7.2 T1c volume=30cc   S/P radiation therapy within four to twelve weeks 08/23/12-10/18/12   prostate,7800cGy/40 sessions   Urinary obstruction     Past Surgical History:  Procedure Laterality Date   HERNIA REPAIR     right inguinal done in early 70's   PROSTATE BIOPSY  01/08/12   Adenocarcinoma,gleason=3+3=6,& 3+4=7,volume=43cc     Family History  Problem Relation Age of Onset   Cancer Sister 61       pancreatic    Social History   Socioeconomic History   Marital status: Divorced    Spouse name: Not on file   Number of children: 2   Years of education: Not on file   Highest education level: Not on file  Occupational History   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[employer]  Tobacco Use   Smoking status: Never Smoker   Smokeless tobacco: Never Used  Scientific laboratory technician Use: Never used  Substance and Sexual Activity   Alcohol use: No   Drug  use: 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     metoprolol succinate (TOPROL-XL) 100 MG 24 hr tablet Take 1 tablet (100 mg total) by mouth in the morning and at bedtime. TAKE 1 TABLET BY MOUTH EACH MORNING 180 tablet 1   olmesartan-hydrochlorothiazide (BENICAR HCT) 40-25 MG tablet TAKE 1 TABLET BY MOUTH EACH MORNING 90 tablet 1   atorvastatin (LIPITOR) 20 MG tablet Take 1 tablet each evening. 90 tablet 1   meloxicam (MOBIC) 7.5 MG tablet Take 1 tablet (7.5 mg total) by mouth daily. (Patient not taking: Reported on 02/07/2020) 30 tablet 0   methocarbamol (ROBAXIN) 500 MG tablet Take 1 tablet (500 mg total)  by mouth 2 (two) times daily as needed for muscle spasms. (Patient not taking: Reported on 02/07/2020) 30 tablet 0   mometasone (NASONEX) 50 MCG/ACT nasal spray Place 2 sprays into the nose daily. (Patient not taking: Reported on 02/07/2020) 17 g 2   naproxen (NAPROSYN) 375 MG tablet Take 1 tablet (375 mg total) by mouth 2 (two) times daily. (Patient not taking: Reported on 02/07/2020) 30 tablet 0   No facility-administered medications prior to visit.    Allergies  Allergen Reactions   Aspirin Nausea Only    High dose asp.... He tolerates 81 mg    Codeine Other (See Comments)    Derivatives, "causes my sinuses to hurt"    Review of Systems  Constitutional: Negative.   HENT: Negative.   Eyes: Negative.   Respiratory: Negative.   Cardiovascular: Negative.    Gastrointestinal: Negative.   Genitourinary: Negative.   Musculoskeletal: Positive for myalgias. Negative for arthralgias.  Skin: Negative.   Neurological: Negative.   Psychiatric/Behavioral: Negative.   All other systems reviewed and are negative.      Objective:    Physical Exam Vitals and nursing note reviewed.  Constitutional:      Appearance: Normal appearance.  Cardiovascular:     Rate and Rhythm: Normal rate and regular rhythm.  Pulmonary:     Effort: Pulmonary effort is normal. No respiratory distress.  Musculoskeletal:        General: No swelling, tenderness, deformity or signs of injury. Normal range of motion.     Right lower leg: No edema.     Left lower leg: No edema.  Skin:    General: Skin is warm and dry.     Capillary Refill: Capillary refill takes less than 2 seconds.     Coloration: Skin is not jaundiced or pale.     Findings: No bruising, erythema, lesion or rash.  Neurological:     General: No focal deficit present.     Mental Status: He is alert and oriented to person, place, and time. Mental status is at baseline.  Psychiatric:        Mood and Affect: Mood normal.        Behavior: Behavior normal.        Thought Content: Thought content normal.        Judgment: Judgment normal.     BP (!) 152/90    Pulse 85    Temp 98 F (36.7 C) (Temporal)    Resp 18    Ht 5\' 8"  (1.727 m)    Wt 213 lb 12.8 oz (97 kg)    SpO2 96%    BMI 32.51 kg/m  Wt Readings from Last 3 Encounters:  02/07/20 213 lb 12.8 oz (97 kg)  07/25/19 210 lb (95.3 kg)  07/08/19 221 lb 3.2 oz (100.3 kg)    Health Maintenance Due  Topic Date Due   COVID-19 Vaccine (3 - Pfizer risk 4-dose series) 06/28/2019    There are no preventive care reminders to display for this patient.   Lab Results  Component Value Date   TSH 5.350 (H) 12/07/2018   Lab Results  Component Value Date   WBC 6.6 12/07/2018   HGB 13.5 12/07/2018   HCT 41.5 12/07/2018   MCV 96 12/07/2018   PLT 151  12/07/2018   Lab Results  Component Value Date   NA 143 12/07/2018   K 4.0 12/07/2018   CO2 22 12/07/2018   GLUCOSE 86 12/07/2018   BUN 26 12/07/2018  CREATININE 1.65 (H) 12/07/2018   BILITOT 0.3 12/07/2018   ALKPHOS 103 12/07/2018   AST 28 12/07/2018   ALT 26 12/07/2018   PROT 7.2 12/07/2018   ALBUMIN 4.2 12/07/2018   CALCIUM 9.3 12/07/2018   Lab Results  Component Value Date   CHOL 144 12/07/2018   Lab Results  Component Value Date   HDL 40 12/07/2018   Lab Results  Component Value Date   LDLCALC 84 12/07/2018   Lab Results  Component Value Date   TRIG 98 12/07/2018   Lab Results  Component Value Date   CHOLHDL 3.6 12/07/2018   Lab Results  Component Value Date   HGBA1C 6.1 (H) 12/07/2018       Assessment & Plan:   Problem List Items Addressed This Visit   None   Visit Diagnoses    Pain of right lower extremity    -  Primary      No orders of the defined types were placed in this encounter.  PLAN  No evidence of muscle damage, dvt, or otherwise  Encourage OTC analgesia, rest, discussed PT as option  Do not feel this is any sciatic / radicular pain - more likely muscle strain  Return prn  Patient encouraged to call clinic with any questions, comments, or concerns.  Maximiano Coss, NP

## 2020-06-10 ENCOUNTER — Other Ambulatory Visit: Payer: Self-pay | Admitting: Registered Nurse

## 2020-06-10 DIAGNOSIS — I10 Essential (primary) hypertension: Secondary | ICD-10-CM

## 2020-06-10 NOTE — Telephone Encounter (Signed)
Courtesy refill - Pt needs office visit. Requested Prescriptions  Pending Prescriptions Disp Refills  . olmesartan-hydrochlorothiazide (BENICAR HCT) 40-25 MG tablet [Pharmacy Med Name: OLMESARTAN-HCTZ 40-25 MG TAB] 30 tablet 0    Sig: TAKE 1 TABLET BY MOUTH EVERY MORNING     Cardiovascular: ARB + Diuretic Combos Failed - 06/10/2020 12:42 AM      Failed - K in normal range and within 180 days    Potassium  Date Value Ref Range Status  12/07/2018 4.0 3.5 - 5.2 mmol/L Final         Failed - Na in normal range and within 180 days    Sodium  Date Value Ref Range Status  12/07/2018 143 134 - 144 mmol/L Final         Failed - Cr in normal range and within 180 days    Creat  Date Value Ref Range Status  11/09/2015 1.36 (H) 0.70 - 1.18 mg/dL Final    Comment:      For patients > or = 77 years of age: The upper reference limit for Creatinine is approximately 13% higher for people identified as African-American.      Creatinine, Ser  Date Value Ref Range Status  12/07/2018 1.65 (H) 0.76 - 1.27 mg/dL Final         Failed - Ca in normal range and within 180 days    Calcium  Date Value Ref Range Status  12/07/2018 9.3 8.6 - 10.2 mg/dL Final         Failed - Last BP in normal range    BP Readings from Last 1 Encounters:  02/07/20 (!) 152/90         Passed - Patient is not pregnant      Passed - Valid encounter within last 6 months    Recent Outpatient Visits          4 months ago Pain of right lower extremity   [facility], Delfino Lovett, NP   11 months ago Essential hypertension   [facility], Delfino Lovett, NP   11 months ago Hamstring muscle strain, left, initial encounter   [facility], Delfino Lovett, NP   1 year ago    [facility], Delfino Lovett, NP   1 year ago Screening for endocrine, metabolic and immunity disorder   [facility], Delfino Lovett, NP

## 2020-07-10 ENCOUNTER — Other Ambulatory Visit: Payer: Self-pay | Admitting: Registered Nurse

## 2020-07-10 DIAGNOSIS — I10 Essential (primary) hypertension: Secondary | ICD-10-CM

## 2020-07-10 NOTE — Telephone Encounter (Signed)
No further refills without office visit. Contact Matthew Wilkerson new location for appt after 07/13/20 at [phone]

## 2020-07-10 NOTE — Telephone Encounter (Signed)
Requested medications are due for refill today yes  Requested medications are on the active medication list yes  Last refill 06/10/20  Last visit 06/2019  Future visit scheduled no  Notes to clinic Has already had a curtesy refill and there is no upcoming appointment scheduled.

## 2020-07-15 ENCOUNTER — Other Ambulatory Visit: Payer: Self-pay | Admitting: Registered Nurse

## 2020-07-15 DIAGNOSIS — I10 Essential (primary) hypertension: Secondary | ICD-10-CM

## 2020-07-16 ENCOUNTER — Other Ambulatory Visit: Payer: Self-pay

## 2020-07-16 ENCOUNTER — Telehealth: Payer: Self-pay | Admitting: Registered Nurse

## 2020-07-16 DIAGNOSIS — I10 Essential (primary) hypertension: Secondary | ICD-10-CM

## 2020-07-16 MED ORDER — METOPROLOL SUCCINATE ER 100 MG PO TB24: 100.0000 mg | ORAL_TABLET | Freq: Two times a day (BID) | ORAL | 1 refills | Status: DC

## 2020-07-16 NOTE — Telephone Encounter (Signed)
Patient medication was sent to the pharmacy 

## 2020-07-16 NOTE — Telephone Encounter (Signed)
Pt called in asking for a refill on the Metoprolol. Pt uses the [facility] on [address]

## 2020-08-01 ENCOUNTER — Other Ambulatory Visit: Payer: Self-pay | Admitting: Registered Nurse

## 2020-08-01 DIAGNOSIS — I10 Essential (primary) hypertension: Secondary | ICD-10-CM

## 2020-08-17 ENCOUNTER — Encounter: Payer: Self-pay | Admitting: Registered Nurse

## 2020-08-17 ENCOUNTER — Ambulatory Visit (INDEPENDENT_AMBULATORY_CARE_PROVIDER_SITE_OTHER): Payer: Medicare Other | Admitting: Registered Nurse

## 2020-08-17 ENCOUNTER — Other Ambulatory Visit: Payer: Self-pay

## 2020-08-17 VITALS — BP 124/84 | HR 80 | Temp 98.3°F | Resp 18 | Ht 68.0 in | Wt 213.6 lb

## 2020-08-17 DIAGNOSIS — M25571 Pain in right ankle and joints of right foot: Secondary | ICD-10-CM

## 2020-08-17 DIAGNOSIS — H6123 Impacted cerumen, bilateral: Secondary | ICD-10-CM

## 2020-08-17 MED ORDER — DICLOFENAC SODIUM 1 % EX GEL: 2.0000 g | Freq: Three times a day (TID) | CUTANEOUS | 0 refills | Status: DC | PRN

## 2020-08-17 NOTE — Patient Instructions (Addendum)
Mr. Matthew Wilkerson to see you as always  Glad we could help with the ears.   I think the ankle may be some arthritis. I've ordered an xray to our office up the road on [address]. They'll give you a call to get this done, or you can call them if you'd like.   Please use the diclofenac topically on the ankle up to three times a day as needed for pain. A heat pack, ice, compression, and elevating the ankle may help as well. Try to make sure you don't injure it!  I'd like to see you in about a month or so to follow up on your blood pressure and cholesterol, and to get some labs. It's been since 2020 that we've checked on these levels  Thank you  [name]     If you have lab work done today you will be contacted with your lab results within the next 2 weeks.  If you have not heard from [place] then please contact us. The fastest way to get your results is to register for My Chart.   IF you received an x-ray today, you will receive an invoice from [facility]. Please contact [facility] at [phone] with questions or concerns regarding your invoice.   IF you received labwork today, you will receive an invoice from [place]. Please contact [facility] at [phone] with questions or concerns regarding your invoice.   Our billing staff will not be able to assist you with questions regarding bills from these companies.  You will be contacted with the lab results as soon as they are available. The fastest way to get your results is to activate your My Chart account. Instructions are located on the last page of this paperwork. If you have not heard from [place] regarding the results in 2 weeks, please contact this office.

## 2020-08-17 NOTE — Progress Notes (Signed)
Established Patient Office Visit  Subjective:  Patient ID: Matthew Wilkerson, male    DOB: 02-Nov-1945  Age: 75 y.o. MRN: [id number]  CC:  Chief Complaint  Patient presents with   Ankle Pain    Patient states he is having some ankle pain for about 2-3 weeks now. Patient states he has been having some pain in his right ear.    HPI Matthew Wilkerson presents for ankle pain and impacted cerumen  Ankle pain: On and off for a few weeks. Intermittent.  Notes that he did not injure the ankle Pain is aching around medial malleolus of R ankle No L ankle involvement  No hx of injury No pain in foot or toes Does not radiate up leg.  Cerumen impaction: R ear feels full Muffled hearing No drainage No local lymphadenopathy No URI symptoms Hx of cerumen impaction in L ear  Past Medical History:  Diagnosis Date   Abnormal PSA 09/2011   7.63,    Allergy    BPH (benign prostatic hyperplasia)    Elevated PSA    Hernia    Hyperlipidemia    Hypertension    Inguinal hernia    left    Prostate CA ([place]) 01/08/12   bx=Adenocarcinoma, gleason 6 (3+3), prostate volume 43 cc   Prostate cancer ([place]) dx 11/2010   prostate ca,PSA=7.2 T1c volume=30cc   S/P radiation therapy within four to twelve weeks 08/23/12-10/18/12   prostate,7800cGy/40 sessions   Urinary obstruction     Past Surgical History:  Procedure Laterality Date   HERNIA REPAIR     right inguinal done in early 70's   PROSTATE BIOPSY  01/08/12   Adenocarcinoma,gleason=3+3=6,& 3+4=7,volume=43cc     Family History  Problem Relation Age of Onset   Cancer Sister 75       pancreatic    Social History   Socioeconomic History   Marital status: Divorced    Spouse name: Not on file   Number of children: 2   Years of education: Not on file   Highest education level: Not on file  Occupational History   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[employer]  Tobacco Use    Smoking status: Never   Smokeless tobacco: Never  Vaping Use   Vaping Use: Never used  Substance and Sexual Activity   Alcohol use: No   Drug use: 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     naproxen (NAPROSYN) 375 MG tablet Take 1 tablet (375 mg total) by mouth 2 (two) times daily. (Patient not taking: Reported on 01/08/2021) 30 tablet 0   atorvastatin (LIPITOR) 20 MG tablet TAKE 1 TABLET BY MOUTH EACH EVENING 90 tablet 1   metoprolol succinate (TOPROL-XL) 100 MG 24 hr tablet Take 1 tablet (100 mg total) by mouth in the morning and at bedtime. TAKE 1 TABLET BY MOUTH EACH  MORNING 180 tablet 1   olmesartan-hydrochlorothiazide (BENICAR HCT) 40-25 MG tablet TAKE 1 TABLET BY MOUTH EVERY DAY IN THE MORNING 30 tablet 0   meloxicam (MOBIC) 7.5 MG tablet Take 1 tablet (7.5 mg total) by mouth daily. (Patient not taking: No sig reported) 30 tablet 0   methocarbamol (ROBAXIN) 500 MG tablet Take 1 tablet (500 mg total) by mouth 2 (two) times daily as needed for muscle spasms. (Patient not taking: No sig reported) 30 tablet 0   mometasone (NASONEX) 50 MCG/ACT nasal spray Place 2 sprays into the nose daily. (Patient not taking: No sig reported) 17 g 2   No facility-administered medications prior to visit.    Allergies  Allergen Reactions   Aspirin Nausea Only    High dose asp.... He tolerates 81 mg    Codeine Other (See Comments)    Derivatives, "causes my sinuses to hurt"    ROS Review of  Systems  Constitutional: Negative.   HENT:  Positive for hearing loss (muffled hearing).   Eyes: Negative.   Respiratory: Negative.    Cardiovascular: Negative.   Gastrointestinal: Negative.   Genitourinary: Negative.   Musculoskeletal:  Positive for arthralgias (R ankle).  Skin: Negative.   Neurological: Negative.   Psychiatric/Behavioral: Negative.    All other systems reviewed and are negative.    Objective:    Physical Exam Constitutional:      General: He is not in acute distress.    Appearance: Normal appearance. He is normal weight. He is not ill-appearing, toxic-appearing or diaphoretic.  HENT:     Right Ear: External ear normal. There is impacted cerumen (R>L).     Left Ear: External ear normal. There is impacted cerumen (R > L).  Cardiovascular:     Rate and Rhythm: Normal rate and regular rhythm.     Heart sounds: Normal heart sounds. No murmur heard.   No friction rub. No gallop.  Pulmonary:     Effort: Pulmonary effort is normal. No respiratory distress.     Breath sounds: Normal breath sounds. No stridor. No wheezing, rhonchi or rales.  Chest:     Chest wall: No tenderness.  Musculoskeletal:        General: No swelling, tenderness, deformity or signs of injury. Normal range of motion.  Skin:    General: Skin is warm and dry.  Neurological:     General: No focal deficit present.     Mental Status: He is alert and oriented to person, place, and time. Mental status is at baseline.  Psychiatric:        Mood and Affect: Mood normal.        Behavior: Behavior normal.        Thought Content: Thought content normal.        Judgment: Judgment normal.    BP 124/84   Pulse 80   Temp 98.3 F (36.8 C) (Temporal)   Resp 18   Ht 5\' 8"  (1.727 m)   Wt 213 lb 9.6 oz (96.9 kg)   SpO2 99%   BMI 32.48 kg/m  Wt Readings from Last 3 Encounters:  01/08/21 210 lb 3.2 oz (95.3 kg)  08/17/20 213 lb 9.6 oz (96.9 kg)  02/07/20 213 lb 12.8 oz (97 kg)     Health  Maintenance Due  Topic Date Due   Pneumonia Vaccine 79+ Years old (1 - PCV) Never done   TETANUS/TDAP  04/22/2019   COVID-19 Vaccine (4 - Booster for [place] series) 09/21/2020  There are no preventive care reminders to display for this patient.  Lab Results  Component Value Date   TSH 4.10 01/08/2021   Lab Results  Component Value Date   WBC 6.9 01/08/2021   HGB 13.3 01/08/2021   HCT 40.1 01/08/2021   MCV 94.0 01/08/2021   PLT 135.0 (L) 01/08/2021   Lab Results  Component Value Date   NA 141 01/08/2021   K 3.4 (L) 01/08/2021   CO2 24 01/08/2021   GLUCOSE 126 (H) 01/08/2021   BUN 33 (H) 01/08/2021   CREATININE 1.72 (H) 01/08/2021   BILITOT 0.5 01/08/2021   ALKPHOS 105 01/08/2021   AST 24 01/08/2021   ALT 23 01/08/2021   PROT 7.3 01/08/2021   ALBUMIN 4.1 01/08/2021   CALCIUM 9.4 01/08/2021   GFR 37.98 (L) 01/08/2021   Lab Results  Component Value Date   CHOL 133 01/08/2021   Lab Results  Component Value Date   HDL 36.50 (L) 01/08/2021   Lab Results  Component Value Date   LDLCALC 76 01/08/2021   Lab Results  Component Value Date   TRIG 106.0 01/08/2021   Lab Results  Component Value Date   CHOLHDL 4 01/08/2021   Lab Results  Component Value Date   HGBA1C 6.4 01/08/2021      Assessment & Plan:   Problem List Items Addressed This Visit   None Visit Diagnoses     Acute right ankle pain    -  Primary   Relevant Medications   diclofenac Sodium (VOLTAREN) 1 % GEL   Other Relevant Orders   DG Ankle Complete Right (Completed)   Bilateral hearing loss due to cerumen impaction           Meds ordered this encounter  Medications   diclofenac Sodium (VOLTAREN) 1 % GEL    Sig: Apply 2 g topically 3 (three) times daily as needed.    Dispense:  50 g    Refill:  0    Order Specific Question:   Supervising Provider    Answer:   Carlota Raspberry, JEFFREY R [[id number]]    Follow-up: Return in about 4 weeks (around 09/14/2020) for HTN and HLD. fasting labs.    PLAN Diclofenac gel for ankle. Suspect some arthritis. Will order xray. No preceding injury is reassuring.  Ears lavaged. Immediate improvement in symptoms Follow up in a month for htn and chronic conditions Patient encouraged to call clinic with any questions, comments, or concerns.  Maximiano Coss, NP

## 2020-08-22 ENCOUNTER — Ambulatory Visit (INDEPENDENT_AMBULATORY_CARE_PROVIDER_SITE_OTHER): Payer: Medicare Other

## 2020-08-22 ENCOUNTER — Other Ambulatory Visit: Payer: Medicare Other

## 2020-08-22 ENCOUNTER — Other Ambulatory Visit: Payer: Self-pay

## 2020-08-22 DIAGNOSIS — M25571 Pain in right ankle and joints of right foot: Secondary | ICD-10-CM

## 2020-08-24 ENCOUNTER — Other Ambulatory Visit: Payer: Self-pay | Admitting: Registered Nurse

## 2020-08-24 DIAGNOSIS — I10 Essential (primary) hypertension: Secondary | ICD-10-CM

## 2020-08-29 ENCOUNTER — Encounter: Payer: Self-pay | Admitting: Registered Nurse

## 2020-09-10 ENCOUNTER — Telehealth: Payer: Self-pay | Admitting: Registered Nurse

## 2020-09-10 NOTE — Telephone Encounter (Signed)
Pt called in stating that he is still having the foot pain. He wanted to know what Richard's next steps for him would be. He asked it he should try some shoe inserts or if he should go see a foot doctor? Pt states that he can be reached on the cell phone or home phone. Ok to send a letter with recommendations.    Please advise

## 2020-09-12 ENCOUNTER — Other Ambulatory Visit: Payer: Self-pay | Admitting: Registered Nurse

## 2020-09-12 DIAGNOSIS — E7800 Pure hypercholesterolemia, unspecified: Secondary | ICD-10-CM

## 2020-09-14 ENCOUNTER — Ambulatory Visit: Payer: Medicare Other | Admitting: Registered Nurse

## 2020-10-01 ENCOUNTER — Other Ambulatory Visit: Payer: Self-pay | Admitting: Registered Nurse

## 2020-10-01 DIAGNOSIS — I10 Essential (primary) hypertension: Secondary | ICD-10-CM

## 2020-10-24 ENCOUNTER — Other Ambulatory Visit: Payer: Self-pay | Admitting: Registered Nurse

## 2020-10-24 DIAGNOSIS — I10 Essential (primary) hypertension: Secondary | ICD-10-CM

## 2020-10-24 NOTE — Telephone Encounter (Signed)
Patient overdue for hypertension follow up

## 2020-11-18 ENCOUNTER — Other Ambulatory Visit: Payer: Self-pay | Admitting: Registered Nurse

## 2020-11-18 DIAGNOSIS — I10 Essential (primary) hypertension: Secondary | ICD-10-CM

## 2020-12-12 ENCOUNTER — Other Ambulatory Visit: Payer: Self-pay | Admitting: Registered Nurse

## 2020-12-12 DIAGNOSIS — I10 Essential (primary) hypertension: Secondary | ICD-10-CM

## 2020-12-23 ENCOUNTER — Telehealth: Payer: Self-pay

## 2020-12-23 NOTE — Telephone Encounter (Signed)
Spoke with patient and he stated he would call the office when he is able to be scheduled, right now its not possible.

## 2021-01-08 ENCOUNTER — Encounter: Payer: Self-pay | Admitting: Registered Nurse

## 2021-01-08 ENCOUNTER — Ambulatory Visit (INDEPENDENT_AMBULATORY_CARE_PROVIDER_SITE_OTHER): Payer: Medicare Other | Admitting: Registered Nurse

## 2021-01-08 ENCOUNTER — Other Ambulatory Visit: Payer: Self-pay

## 2021-01-08 VITALS — BP 132/83 | HR 89 | Temp 98.2°F | Resp 18 | Ht 66.0 in | Wt 210.2 lb

## 2021-01-08 DIAGNOSIS — I10 Essential (primary) hypertension: Secondary | ICD-10-CM | POA: Diagnosis not present

## 2021-01-08 DIAGNOSIS — Z125 Encounter for screening for malignant neoplasm of prostate: Secondary | ICD-10-CM | POA: Diagnosis not present

## 2021-01-08 DIAGNOSIS — Z6832 Body mass index (BMI) 32.0-32.9, adult: Secondary | ICD-10-CM | POA: Diagnosis not present

## 2021-01-08 DIAGNOSIS — E7800 Pure hypercholesterolemia, unspecified: Secondary | ICD-10-CM

## 2021-01-08 DIAGNOSIS — E6609 Other obesity due to excess calories: Secondary | ICD-10-CM | POA: Diagnosis not present

## 2021-01-08 LAB — HEMOGLOBIN A1C: Hgb A1c MFr Bld: 6.4 % (ref 4.6–6.5)

## 2021-01-08 LAB — LIPID PANEL
Cholesterol: 133 mg/dL (ref 0–200)
HDL: 36.5 mg/dL — ABNORMAL LOW (ref >39.00)
LDL Cholesterol: 76 mg/dL (ref 0–99)
NonHDL: 96.87
Total CHOL/HDL Ratio: 4
Triglycerides: 106 mg/dL (ref 0.0–149.0)
VLDL: 21.2 mg/dL (ref 0.0–40.0)

## 2021-01-08 LAB — COMPREHENSIVE METABOLIC PANEL
ALT: 23 U/L (ref 0–53)
AST: 24 U/L (ref 0–37)
Albumin: 4.1 g/dL (ref 3.5–5.2)
Alkaline Phosphatase: 105 U/L (ref 39–117)
BUN: 33 mg/dL — ABNORMAL HIGH (ref 6–23)
CO2: 24 mEq/L (ref 19–32)
Calcium: 9.4 mg/dL (ref 8.4–10.5)
Chloride: 108 mEq/L (ref 96–112)
Creatinine, Ser: 1.72 mg/dL — ABNORMAL HIGH (ref 0.40–1.50)
GFR: 37.98 mL/min — ABNORMAL LOW (ref >60.00)
Glucose, Bld: 126 mg/dL — ABNORMAL HIGH (ref 70–99)
Potassium: 3.4 mEq/L — ABNORMAL LOW (ref 3.5–5.1)
Sodium: 141 mEq/L (ref 135–145)
Total Bilirubin: 0.5 mg/dL (ref 0.2–1.2)
Total Protein: 7.3 g/dL (ref 6.0–8.3)

## 2021-01-08 LAB — CBC WITH DIFFERENTIAL/PLATELET
Basophils Absolute: 0 10*3/uL (ref 0.0–0.1)
Basophils Relative: 0.7 % (ref 0.0–3.0)
Eosinophils Absolute: 0.4 10*3/uL (ref 0.0–0.7)
Eosinophils Relative: 5.9 % — ABNORMAL HIGH (ref 0.0–5.0)
HCT: 40.1 % (ref 39.0–52.0)
Hemoglobin: 13.3 g/dL (ref 13.0–17.0)
Lymphocytes Relative: 21.7 % (ref 12.0–46.0)
Lymphs Abs: 1.5 10*3/uL (ref 0.7–4.0)
MCHC: 33.1 g/dL (ref 30.0–36.0)
MCV: 94 fl (ref 78.0–100.0)
Monocytes Absolute: 0.5 10*3/uL (ref 0.1–1.0)
Monocytes Relative: 6.6 % (ref 3.0–12.0)
Neutro Abs: 4.5 10*3/uL (ref 1.4–7.7)
Neutrophils Relative %: 65.1 % (ref 43.0–77.0)
Platelets: 135 10*3/uL — ABNORMAL LOW (ref 150.0–400.0)
RBC: 4.26 Mil/uL (ref 4.22–5.81)
RDW: 15.6 % — ABNORMAL HIGH (ref 11.5–15.5)
WBC: 6.9 10*3/uL (ref 4.0–10.5)

## 2021-01-08 LAB — TSH: TSH: 4.1 u[IU]/mL (ref 0.35–5.50)

## 2021-01-08 LAB — PSA, MEDICARE: PSA: 0.67 ng/ml (ref 0.10–4.00)

## 2021-01-08 MED ORDER — OLMESARTAN MEDOXOMIL-HCTZ 40-25 MG PO TABS: 1.0000 | ORAL_TABLET | Freq: Every day | ORAL | 1 refills | Status: DC

## 2021-01-08 MED ORDER — ATORVASTATIN CALCIUM 20 MG PO TABS: 20.0000 mg | ORAL_TABLET | Freq: Every day | ORAL | 1 refills | Status: DC

## 2021-01-08 MED ORDER — METOPROLOL SUCCINATE ER 100 MG PO TB24: 100.0000 mg | ORAL_TABLET | Freq: Two times a day (BID) | ORAL | 1 refills | Status: DC

## 2021-01-08 MED ORDER — METOPROLOL SUCCINATE ER 100 MG PO TB24: 100.0000 mg | ORAL_TABLET | Freq: Every day | ORAL | 1 refills | Status: DC

## 2021-01-08 NOTE — Patient Instructions (Addendum)
Mr. Matthew Wilkerson to see you  Things seem stable.  Let's follow up in 6 mo, sooner if you need anything  Thank you  Matthew Wilkerson     If you have lab work done today you will be contacted with your lab results within the next 2 weeks.  If you have not heard from [place] then please contact us. The fastest way to get your results is to register for My Chart.   IF you received an x-ray today, you will receive an invoice from [facility]. Please contact [facility] at [phone] with questions or concerns regarding your invoice.   IF you received labwork today, you will receive an invoice from [place]. Please contact [facility] at [phone] with questions or concerns regarding your invoice.   Our billing staff will not be able to assist you with questions regarding bills from these companies.  You will be contacted with the lab results as soon as they are available. The fastest way to get your results is to activate your My Chart account. Instructions are located on the last page of this paperwork. If you have not heard from [place] regarding the results in 2 weeks, please contact this office.

## 2021-01-08 NOTE — Progress Notes (Signed)
Established Patient Office Visit  Subjective:  Patient ID: Matthew Wilkerson, male    DOB: 12-25-1945  Age: 75 y.o. MRN: [id number]  CC:  Chief Complaint  Patient presents with   Medication Refill    Patient states he is here for a medication refill. Patient has no other concerns.    HPI Matthew Wilkerson presents for Med refill  Hypertension: Patient Currently taking: olmesartan-hctz 40-25mg  po qd, metoprolol 50mg  ER po bid Good effect. No AEs. Denies CV symptoms including: chest pain, shob, doe, headache, visual changes, fatigue, claudication, and dependent edema.   Previous readings and labs: BP Readings from Last 3 Encounters:  01/08/21 132/83  08/17/20 124/84  02/07/20 (!) 152/90   Lab Results  Component Value Date   CREATININE 1.65 (H) 12/07/2018    HLD Atorvastatin 20mg  po qd Good effect, no AE Lab Results  Component Value Date   CHOL 144 12/07/2018   HDL 40 12/07/2018   LDLCALC 84 12/07/2018   TRIG 98 12/07/2018   CHOLHDL 3.6 12/07/2018      Past Medical History:  Diagnosis Date   Abnormal PSA 09/2011   7.63,    Allergy    BPH (benign prostatic hyperplasia)    Elevated PSA    Hernia    Hyperlipidemia    Hypertension    Inguinal hernia    left    Prostate CA ([place]) 01/08/12   bx=Adenocarcinoma, gleason 6 (3+3), prostate volume 43 cc   Prostate cancer ([place]) dx 11/2010   prostate ca,PSA=7.2 T1c volume=30cc   S/P radiation therapy within four to twelve weeks 08/23/12-10/18/12   prostate,7800cGy/40 sessions   Urinary obstruction     Past Surgical History:  Procedure Laterality Date   HERNIA REPAIR     right inguinal done in early 70's   PROSTATE BIOPSY  01/08/12   Adenocarcinoma,gleason=3+3=6,& 3+4=7,volume=43cc     Family History  Problem Relation Age of Onset   Cancer Sister 40       pancreatic    Social History   Socioeconomic History   Marital status: Divorced    Spouse name: Not on file   Number of children: 2   Years of  education: Not on file   Highest education level: Not on file  Occupational History   Occupation: Retired    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[employer]  Tobacco Use   Smoking status: Never   Smokeless tobacco: Never  Vaping Use   Vaping Use: Never used  Substance and Sexual Activity   Alcohol use: No   Drug use: 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     atorvastatin (LIPITOR) 20 MG tablet TAKE 1 TABLET BY MOUTH EACH EVENING 90 tablet 1   metoprolol succinate (TOPROL-XL) 100 MG 24 hr tablet Take 1 tablet (100 mg total) by mouth in the morning and at bedtime. TAKE 1 TABLET BY  MOUTH EACH MORNING 180 tablet 1   olmesartan-hydrochlorothiazide (BENICAR HCT) 40-25 MG tablet TAKE 1 TABLET BY MOUTH EVERY DAY IN THE MORNING 30 tablet 0   diclofenac Sodium (VOLTAREN) 1 % GEL Apply 2 g topically 3 (three) times daily as needed. (Patient not taking: Reported on 01/08/2021) 50 g 0   meloxicam (MOBIC) 7.5 MG tablet Take 1 tablet (7.5 mg total) by mouth daily. (Patient not taking: No sig reported) 30 tablet 0   methocarbamol (ROBAXIN) 500 MG tablet Take 1 tablet (500 mg total) by mouth 2 (two) times daily as needed for muscle spasms. (Patient not taking: No sig reported) 30 tablet 0   mometasone (NASONEX) 50 MCG/ACT nasal spray Place 2 sprays into the nose daily. (Patient not taking: No sig reported) 17 g 2   naproxen (NAPROSYN) 375 MG tablet Take 1 tablet (375 mg  total) by mouth 2 (two) times daily. (Patient not taking: Reported on 01/08/2021) 30 tablet 0   No facility-administered medications prior to visit.    Allergies  Allergen Reactions   Aspirin Nausea Only    High dose asp.... He tolerates 81 mg    Codeine Other (See Comments)    Derivatives, "causes my sinuses to hurt"    ROS Review of Systems  Constitutional: Negative.   HENT: Negative.    Eyes: Negative.   Respiratory: Negative.    Cardiovascular: Negative.   Gastrointestinal: Negative.   Genitourinary: Negative.   Musculoskeletal: Negative.   Skin: Negative.   Neurological: Negative.   Psychiatric/Behavioral: Negative.    All other systems reviewed and are negative.    Objective:    Physical Exam Constitutional:      General: He is not in acute distress.    Appearance: Normal appearance. He is normal weight. He is not ill-appearing, toxic-appearing or diaphoretic.  Cardiovascular:     Rate and Rhythm: Normal rate and regular rhythm.     Heart sounds: Normal heart sounds. No murmur heard.   No friction rub. No gallop.  Pulmonary:     Effort: Pulmonary effort is normal. No respiratory distress.     Breath sounds: Normal breath sounds. No stridor. No wheezing, rhonchi or rales.  Chest:     Chest wall: No tenderness.  Neurological:     General: No focal deficit present.     Mental Status: He is alert and oriented to person, place, and time. Mental status is at baseline.  Psychiatric:        Mood and Affect: Mood normal.        Behavior: Behavior normal.        Thought Content: Thought content normal.        Judgment: Judgment normal.    BP 132/83   Pulse 89   Temp 98.2 F (36.8 C) (Temporal)   Resp 18   Ht 5\' 6"  (1.676 m)   Wt 210 lb 3.2 oz (95.3 kg)   SpO2 99%   BMI 33.93 kg/m  Wt Readings from Last 3 Encounters:  01/08/21 210 lb 3.2 oz (95.3 kg)  08/17/20 213 lb 9.6 oz (96.9 kg)  02/07/20 213 lb 12.8 oz (97 kg)     There are no preventive care  reminders to display for this patient.  There are no preventive care reminders to display for this patient.  Lab Results  Component Value Date   TSH 5.350 (H) 12/07/2018   Lab Results  Component Value Date   WBC 6.6 12/07/2018   HGB 13.5 12/07/2018  HCT 41.5 12/07/2018   MCV 96 12/07/2018   PLT 151 12/07/2018   Lab Results  Component Value Date   NA 143 12/07/2018   K 4.0 12/07/2018   CO2 22 12/07/2018   GLUCOSE 86 12/07/2018   BUN 26 12/07/2018   CREATININE 1.65 (H) 12/07/2018   BILITOT 0.3 12/07/2018   ALKPHOS 103 12/07/2018   AST 28 12/07/2018   ALT 26 12/07/2018   PROT 7.2 12/07/2018   ALBUMIN 4.2 12/07/2018   CALCIUM 9.3 12/07/2018   Lab Results  Component Value Date   CHOL 144 12/07/2018   Lab Results  Component Value Date   HDL 40 12/07/2018   Lab Results  Component Value Date   LDLCALC 84 12/07/2018   Lab Results  Component Value Date   TRIG 98 12/07/2018   Lab Results  Component Value Date   CHOLHDL 3.6 12/07/2018   Lab Results  Component Value Date   HGBA1C 6.1 (H) 12/07/2018      Assessment & Plan:   Problem List Items Addressed This Visit       Cardiovascular and Mediastinum   Essential hypertension   Relevant Medications   olmesartan-hydrochlorothiazide (BENICAR HCT) 40-25 MG tablet   atorvastatin (LIPITOR) 20 MG tablet   metoprolol succinate (TOPROL-XL) 100 MG 24 hr tablet   Other Relevant Orders   CBC with Differential/Platelet   Comprehensive metabolic panel   Lipid panel     Other   Hyperlipidemia   Relevant Medications   olmesartan-hydrochlorothiazide (BENICAR HCT) 40-25 MG tablet   atorvastatin (LIPITOR) 20 MG tablet   metoprolol succinate (TOPROL-XL) 100 MG 24 hr tablet   Other Relevant Orders   Lipid panel   Class 1 obesity due to excess calories with serious comorbidity and body mass index (BMI) of 32.0 to 32.9 in adult - Primary   Relevant Orders   Hemoglobin A1c   TSH   Other Visit Diagnoses      Screening PSA (prostate specific antigen)       Relevant Orders   PSA, Medicare ( [place] only)       Meds ordered this encounter  Medications   olmesartan-hydrochlorothiazide (BENICAR HCT) 40-25 MG tablet    Sig: Take 1 tablet by mouth daily.    Dispense:  90 tablet    Refill:  1    Order Specific Question:   Supervising Provider    Answer:   Carlota Raspberry, JEFFREY R [[id number]]   DISCONTD: metoprolol succinate (TOPROL-XL) 100 MG 24 hr tablet    Sig: Take 1 tablet (100 mg total) by mouth in the morning and at bedtime.    Dispense:  180 tablet    Refill:  1    Order Specific Question:   Supervising Provider    Answer:   Carlota Raspberry, JEFFREY R [[id number]]   atorvastatin (LIPITOR) 20 MG tablet    Sig: Take 1 tablet (20 mg total) by mouth daily. Around dinner time.    Dispense:  90 tablet    Refill:  1    Order Specific Question:   Supervising Provider    Answer:   Carlota Raspberry, JEFFREY R [[id number]]   metoprolol succinate (TOPROL-XL) 100 MG 24 hr tablet    Sig: Take 1 tablet (100 mg total) by mouth daily. Take with or immediately following a meal.    Dispense:  90 tablet    Refill:  1    This is correct rx.    Order Specific Question:   Supervising Provider  Answer:   Carlota Raspberry, JEFFREY R [[id number]]    Follow-up: Return in about 6 months (around 07/08/2021) for HTN, HLD.   PLAN Refill meds x 6 mo. Htn well controlled.  Labs collected. Will follow up with the patient as warranted. Med check in 6 mo Patient encouraged to call clinic with any questions, comments, or concerns.  Maximiano Coss, NP

## 2021-01-15 ENCOUNTER — Encounter: Payer: Self-pay | Admitting: Registered Nurse

## 2021-01-21 ENCOUNTER — Telehealth: Payer: Self-pay

## 2021-01-21 NOTE — Telephone Encounter (Signed)
Pt lab work come back and was told to stop taking ibuprofen. Pt takes low does Asprin daily 81 mg along with olmesartan-hydrochlorothiazide (BENICAR HCT) 40-25 MG tablet [[id number]]   Pt call back [phone]

## 2021-01-21 NOTE — Telephone Encounter (Signed)
Called and spoke with patient about lab results. Patient wanted to know if he needed to stop taking his Asprin 81 and his b/p meds. I informed him that per pcp wants him to avoid ibprofen and naproxen. Patient understood. No further concerns at this time.

## 2021-04-03 ENCOUNTER — Other Ambulatory Visit: Payer: Self-pay

## 2021-04-03 ENCOUNTER — Ambulatory Visit (INDEPENDENT_AMBULATORY_CARE_PROVIDER_SITE_OTHER): Payer: Medicare Other | Admitting: Registered Nurse

## 2021-04-03 ENCOUNTER — Encounter: Payer: Self-pay | Admitting: Registered Nurse

## 2021-04-03 VITALS — BP 139/80 | HR 68 | Temp 98.4°F | Resp 18 | Ht 66.0 in | Wt 202.4 lb

## 2021-04-03 DIAGNOSIS — M79672 Pain in left foot: Secondary | ICD-10-CM

## 2021-04-03 DIAGNOSIS — R944 Abnormal results of kidney function studies: Secondary | ICD-10-CM

## 2021-04-03 DIAGNOSIS — M79671 Pain in right foot: Secondary | ICD-10-CM | POA: Diagnosis not present

## 2021-04-03 MED ORDER — TRAMADOL HCL 50 MG PO TABS
50.0000 mg | ORAL_TABLET | Freq: Three times a day (TID) | ORAL | 0 refills | Status: AC | PRN
Start: 2021-04-03 — End: 2021-04-08

## 2021-04-03 NOTE — Progress Notes (Signed)
Established Patient Office Visit  Subjective:  Patient ID: Matthew Wilkerson, male    DOB: Mar 24, 1946  Age: 75 y.o. MRN: [id number]  CC:  Chief Complaint  Patient presents with   Pain    Patient states he has been having some pain in both ankles for about thanksgiving. Pt states its mostly the left but right comes and oes.    HPI Matthew Wilkerson presents for foot pain  Since Thanksgiving Bilateral, L>R. R seems to wax and wane.   L: lateral malleolus. Some swelling. Tender at times. Pain when raising heel. No achilles pain or plantar pain.  R: under ball of foot. Worse when bearing weight. Not swollen. No redness.  No other recent changes  No recent uric acid check. He does have low GFR.  Past Medical History:  Diagnosis Date   Abnormal PSA 09/2011   7.63,    Allergy    BPH (benign prostatic hyperplasia)    Elevated PSA    Hernia    Hyperlipidemia    Hypertension    Inguinal hernia    left    Prostate CA ([place]) 01/08/12   bx=Adenocarcinoma, gleason 6 (3+3), prostate volume 43 cc   Prostate cancer ([place]) dx 11/2010   prostate ca,PSA=7.2 T1c volume=30cc   S/P radiation therapy within four to twelve weeks 08/23/12-10/18/12   prostate,7800cGy/40 sessions   Urinary obstruction     Past Surgical History:  Procedure Laterality Date   HERNIA REPAIR     right inguinal done in early 70's   PROSTATE BIOPSY  01/08/12   Adenocarcinoma,gleason=3+3=6,& 3+4=7,volume=43cc     Family History  Problem Relation Age of Onset   Cancer Sister 22       pancreatic    Social History   Socioeconomic History   Marital status: Divorced    Spouse name: Not on file   Number of children: 2   Years of education: Not on file   Highest education level: Not on file  Occupational History   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[employer]  Tobacco Use   Smoking status: Never   Smokeless tobacco: Never  Vaping Use   Vaping Use:  Never used  Substance and Sexual Activity   Alcohol use: No   Drug use: 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     atorvastatin (LIPITOR) 20 MG tablet Take 1 tablet (20 mg total) by mouth daily. Around dinner time. 90 tablet 1   metoprolol succinate (TOPROL-XL) 100 MG 24 hr tablet Take 1 tablet (100 mg total) by mouth daily. Take with or immediately following a meal. 90 tablet 1   olmesartan-hydrochlorothiazide (BENICAR HCT) 40-25 MG tablet Take 1 tablet by mouth daily. 90 tablet 1   diclofenac Sodium (VOLTAREN) 1 % GEL Apply 2 g topically 3 (three) times daily as needed. (Patient not taking: Reported on  04/03/2021) 50 g 0   meloxicam (MOBIC) 7.5 MG tablet Take 1 tablet (7.5 mg total) by mouth daily. (Patient not taking: Reported on 02/07/2020) 30 tablet 0   methocarbamol (ROBAXIN) 500 MG tablet Take 1 tablet (500 mg total) by mouth 2 (two) times daily as needed for muscle spasms. (Patient not taking: Reported on 02/07/2020) 30 tablet 0   mometasone (NASONEX) 50 MCG/ACT nasal spray Place 2 sprays into the nose daily. (Patient not taking: No sig reported) 17 g 2   naproxen (NAPROSYN) 375 MG tablet Take 1 tablet (375 mg total) by mouth 2 (two) times daily. (Patient not taking: Reported on 01/08/2021) 30 tablet 0   No facility-administered medications prior to visit.    Allergies  Allergen Reactions   Aspirin Nausea Only    High dose asp.... He tolerates 81 mg    Codeine Other (See  Comments)    Derivatives, "causes my sinuses to hurt"    ROS Review of Systems  Constitutional: Negative.   HENT: Negative.    Eyes: Negative.   Respiratory: Negative.    Cardiovascular: Negative.   Gastrointestinal: Negative.   Genitourinary: Negative.   Musculoskeletal: Negative.   Skin: Negative.   Neurological: Negative.   Psychiatric/Behavioral: Negative.    All other systems reviewed and are negative.    Objective:    Physical Exam Constitutional:      General: He is not in acute distress.    Appearance: Normal appearance. He is normal weight. He is not ill-appearing, toxic-appearing or diaphoretic.  Cardiovascular:     Rate and Rhythm: Normal rate and regular rhythm.     Heart sounds: Normal heart sounds. No murmur heard.   No friction rub. No gallop.  Pulmonary:     Effort: Pulmonary effort is normal. No respiratory distress.     Breath sounds: Normal breath sounds. No stridor. No wheezing, rhonchi or rales.  Chest:     Chest wall: No tenderness.  Musculoskeletal:        General: Tenderness (L lateral malleolus, R first mtp) present. No swelling, deformity or signs of injury. Normal range of motion.     Right lower leg: No edema.     Left lower leg: Edema (+1 pitting) present.  Skin:    Capillary Refill: Capillary refill takes less than 2 seconds.  Neurological:     General: No focal deficit present.     Mental Status: He is alert and oriented to person, place, and time. Mental status is at baseline.  Psychiatric:        Mood and Affect: Mood normal.        Behavior: Behavior normal.        Thought Content: Thought content normal.        Judgment: Judgment normal.    BP 139/80    Pulse 68    Temp 98.4 F (36.9 C) (Temporal)    Resp 18    Ht 5\' 6"  (1.676 m)    Wt 202 lb 6.4 oz (91.8 kg)    SpO2 99%    BMI 32.67 kg/m  Wt Readings from Last 3 Encounters:  04/03/21 202 lb 6.4 oz (91.8 kg)  01/08/21 210 lb 3.2 oz (95.3 kg)  08/17/20 213 lb 9.6 oz (96.9 kg)      Health Maintenance Due  Topic Date Due   Pneumonia Vaccine 59+ Years old (1 - PCV) Never done   TETANUS/TDAP  04/22/2019   COVID-19 Vaccine (4 - Booster for [place] series) 09/21/2020  There are no preventive care reminders to display for this patient.  Lab Results  Component Value Date   TSH 4.10 01/08/2021   Lab Results  Component Value Date   WBC 6.9 01/08/2021   HGB 13.3 01/08/2021   HCT 40.1 01/08/2021   MCV 94.0 01/08/2021   PLT 135.0 (L) 01/08/2021   Lab Results  Component Value Date   NA 141 01/08/2021   K 3.4 (L) 01/08/2021   CO2 24 01/08/2021   GLUCOSE 126 (H) 01/08/2021   BUN 33 (H) 01/08/2021   CREATININE 1.72 (H) 01/08/2021   BILITOT 0.5 01/08/2021   ALKPHOS 105 01/08/2021   AST 24 01/08/2021   ALT 23 01/08/2021   PROT 7.3 01/08/2021   ALBUMIN 4.1 01/08/2021   CALCIUM 9.4 01/08/2021   GFR 37.98 (L) 01/08/2021   Lab Results  Component Value Date   CHOL 133 01/08/2021   Lab Results  Component Value Date   HDL 36.50 (L) 01/08/2021   Lab Results  Component Value Date   LDLCALC 76 01/08/2021   Lab Results  Component Value Date   TRIG 106.0 01/08/2021   Lab Results  Component Value Date   CHOLHDL 4 01/08/2021   Lab Results  Component Value Date   HGBA1C 6.4 01/08/2021      Assessment & Plan:   Problem List Items Addressed This Visit   None Visit Diagnoses     Bilateral foot pain    -  Primary   Relevant Medications   traMADol (ULTRAM) 50 MG tablet   Other Relevant Orders   Ambulatory referral to Podiatry   Uric acid   Basic Metabolic Panel (BMET)   Decreased GFR       Relevant Orders   Uric acid   Basic Metabolic Panel (BMET)       Meds ordered this encounter  Medications   traMADol (ULTRAM) 50 MG tablet    Sig: Take 1 tablet (50 mg total) by mouth every 8 (eight) hours as needed for up to 5 days.    Dispense:  15 tablet    Refill:  0    Order Specific Question:   Supervising Provider    Answer:   Carlota Raspberry,  JEFFREY R [[id number]]    Follow-up: Return if symptoms worsen or fail to improve, for or as scheduled. Marland Kitchen   PLAN Perhaps gout given low GFR. Will draw uric acid and check GFR today.  Refill tramadol - tolerates well - discussed safe use. Continue icy hot Refer to podiatry Patient encouraged to call clinic with any questions, comments, or concerns.  Maximiano Coss, NP

## 2021-04-03 NOTE — Patient Instructions (Addendum)
Mr. Devlyn Retter to see you  I have referred to podiatry for further investigation into this pain  Use tramadol 1-2 times daily as needed for pain Continue icy hot  Elevate feet when swelling happens.  I'll let you know how labs look  Thank you  Rich     If you have lab work done today you will be contacted with your lab results within the next 2 weeks.  If you have not heard from [place] then please contact us. The fastest way to get your results is to register for My Chart.   IF you received an x-ray today, you will receive an invoice from [facility]. Please contact [facility] at [phone] with questions or concerns regarding your invoice.   IF you received labwork today, you will receive an invoice from [facility] [facility]. Please contact [facility] at [phone] with questions or concerns regarding your invoice.   Our billing staff will not be able to assist you with questions regarding bills from these companies.  You will be contacted with the lab results as soon as they are available. The fastest way to get your results is to activate your My Chart account. Instructions are located on the last page of this paperwork. If you have not heard from [place] regarding the results in 2 weeks, please contact this office.

## 2021-04-05 ENCOUNTER — Other Ambulatory Visit (INDEPENDENT_AMBULATORY_CARE_PROVIDER_SITE_OTHER): Payer: Medicare Other

## 2021-04-05 DIAGNOSIS — M79672 Pain in left foot: Secondary | ICD-10-CM | POA: Diagnosis not present

## 2021-04-05 DIAGNOSIS — R944 Abnormal results of kidney function studies: Secondary | ICD-10-CM | POA: Diagnosis not present

## 2021-04-05 DIAGNOSIS — M79671 Pain in right foot: Secondary | ICD-10-CM | POA: Diagnosis not present

## 2021-04-05 LAB — BASIC METABOLIC PANEL WITH GFR
BUN: 35 mg/dL — ABNORMAL HIGH (ref 6–23)
CO2: 24 meq/L (ref 19–32)
Calcium: 9.4 mg/dL (ref 8.4–10.5)
Chloride: 102 meq/L (ref 96–112)
Creatinine, Ser: 2.06 mg/dL — ABNORMAL HIGH (ref 0.40–1.50)
GFR: 30.53 mL/min — ABNORMAL LOW
Glucose, Bld: 80 mg/dL (ref 70–99)
Potassium: 3.7 meq/L (ref 3.5–5.1)
Sodium: 139 meq/L (ref 135–145)

## 2021-04-05 LAB — URIC ACID: Uric Acid, Serum: 10.2 mg/dL — ABNORMAL HIGH (ref 4.0–7.8)

## 2021-04-08 ENCOUNTER — Other Ambulatory Visit: Payer: Self-pay | Admitting: Registered Nurse

## 2021-04-08 DIAGNOSIS — E790 Hyperuricemia without signs of inflammatory arthritis and tophaceous disease: Secondary | ICD-10-CM

## 2021-04-08 DIAGNOSIS — R944 Abnormal results of kidney function studies: Secondary | ICD-10-CM

## 2021-04-08 MED ORDER — ALLOPURINOL 100 MG PO TABS: 50.0000 mg | ORAL_TABLET | Freq: Every day | ORAL | 1 refills | Status: AC

## 2021-04-09 ENCOUNTER — Telehealth: Payer: Self-pay | Admitting: *Deleted

## 2021-04-09 NOTE — Telephone Encounter (Signed)
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 He states he will bring records in to support documentation.

## 2021-04-09 NOTE — Telephone Encounter (Signed)
Patient medical records are being requested per Fabi. When medical records come back then the letter could be written.

## 2021-04-10 ENCOUNTER — Encounter: Payer: Self-pay | Admitting: Registered Nurse

## 2021-04-11 ENCOUNTER — Ambulatory Visit: Payer: Medicare Other | Admitting: Podiatry

## 2021-04-23 ENCOUNTER — Ambulatory Visit: Payer: Medicare Other

## 2021-04-23 ENCOUNTER — Other Ambulatory Visit: Payer: Self-pay

## 2021-04-23 ENCOUNTER — Ambulatory Visit: Payer: Medicare Other | Admitting: Podiatry

## 2021-04-23 DIAGNOSIS — M7750 Other enthesopathy of unspecified foot: Secondary | ICD-10-CM

## 2021-04-23 DIAGNOSIS — M1A3790 Chronic gout due to renal impairment, unspecified ankle and foot, without tophus (tophi): Secondary | ICD-10-CM | POA: Diagnosis not present

## 2021-04-23 MED ORDER — COLCHICINE 0.6 MG PO TABS: ORAL_TABLET | ORAL | 2 refills | Status: AC

## 2021-04-23 NOTE — Progress Notes (Signed)
°  Subjective:  Patient ID: Matthew Wilkerson, male    DOB: 16-Aug-1945,  MRN: [id number]  Chief Complaint  Patient presents with   Foot Pain      (np) Bilateral foot pain    76 y.o. male presents with the above complaint. History confirmed with patient.  Reports he has had on and off bilateral foot and ankle pain and swelling.  His PCP sent him for lab work and his uric acid was elevated.  They placed him on allopurinol.  He started taking it but after 2 days had GI side effects and stopped taking it.  Currently not hurting today  Objective:  Physical Exam: warm, good capillary refill, no trophic changes or ulcerative lesions, normal DP and PT pulses, normal sensory exam, and some edema around the bilateral ankle, good range of motion, no pain on palpation or manipulation today of both feet or ankles. Assessment:   1. 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  Think likely the intermittent swelling and pain he is having is likely acute on chronic gout.  I discussed the treatment of both acute and chronic gout for him.  We also discussed the etiology of gout and how diet and hydration plays into this.  Did not tolerate allopurinol.  I encouraged him to discuss a different chronic medication such as Uloric with his PCP which she will do.  I did provide him with a prescription for colchicine to have on hand when this begins to flareup.  Also discussed with him if it is severe we could do an intra-articular steroid joint injection he will return to see me as needed for this.  Return if symptoms worsen or fail to improve.

## 2021-05-30 ENCOUNTER — Other Ambulatory Visit: Payer: Self-pay | Admitting: Nephrology

## 2021-05-30 DIAGNOSIS — N1832 Chronic kidney disease, stage 3b: Secondary | ICD-10-CM

## 2021-06-11 ENCOUNTER — Ambulatory Visit: Payer: Medicare Other | Admitting: Podiatry

## 2021-07-03 ENCOUNTER — Other Ambulatory Visit: Payer: Medicare Other

## 2021-07-05 ENCOUNTER — Other Ambulatory Visit: Payer: Self-pay | Admitting: Registered Nurse

## 2021-07-05 DIAGNOSIS — I10 Essential (primary) hypertension: Secondary | ICD-10-CM

## 2021-07-09 ENCOUNTER — Ambulatory Visit: Payer: Medicare Other | Admitting: Registered Nurse

## 2021-07-11 ENCOUNTER — Other Ambulatory Visit: Payer: Self-pay | Admitting: Registered Nurse

## 2021-07-11 DIAGNOSIS — E7800 Pure hypercholesterolemia, unspecified: Secondary | ICD-10-CM

## 2021-07-29 ENCOUNTER — Ambulatory Visit: Payer: Medicare Other | Admitting: Podiatry

## 2021-10-05 ENCOUNTER — Other Ambulatory Visit: Payer: Self-pay | Admitting: Registered Nurse

## 2021-10-05 DIAGNOSIS — I10 Essential (primary) hypertension: Secondary | ICD-10-CM

## 2022-01-20 ENCOUNTER — Other Ambulatory Visit: Payer: Self-pay

## 2022-01-20 ENCOUNTER — Telehealth: Payer: Self-pay | Admitting: Registered Nurse

## 2022-01-20 DIAGNOSIS — I10 Essential (primary) hypertension: Secondary | ICD-10-CM

## 2022-01-20 MED ORDER — OLMESARTAN MEDOXOMIL-HCTZ 40-25 MG PO TABS: 1.0000 | ORAL_TABLET | Freq: Every day | ORAL | 0 refills | Status: DC

## 2022-01-20 NOTE — Telephone Encounter (Signed)
Informed pt that I sent in his Rx refills and gave the numbers to other office's for him to est care

## 2022-01-20 NOTE — Telephone Encounter (Signed)
Encourage patient to contact the pharmacy for refills or they can request refills through [facility]  (Please schedule appointment if patient has not been seen in over a year)  04/03/21  WHAT PHARMACY WOULD THEY LIKE THIS SENT TO: [id number] [place] Olmesartan-hydrochlorothiazide 40-25 mg  NOTES/COMMENTS FROM PATIENT: I offer pt an appt, pt refused pt.      [place]: It takes 48-72 hours to process rx refill requests Ask patient to call pharmacy to ensure rx is ready before heading there.

## 2022-02-11 ENCOUNTER — Ambulatory Visit (INDEPENDENT_AMBULATORY_CARE_PROVIDER_SITE_OTHER): Payer: Medicare Other | Admitting: Family

## 2022-02-11 ENCOUNTER — Encounter: Payer: Self-pay | Admitting: Family

## 2022-02-11 VITALS — BP 138/88 | HR 78 | Temp 98.1°F | Ht 66.0 in | Wt 208.8 lb

## 2022-02-11 DIAGNOSIS — R739 Hyperglycemia, unspecified: Secondary | ICD-10-CM

## 2022-02-11 DIAGNOSIS — I10 Essential (primary) hypertension: Secondary | ICD-10-CM | POA: Diagnosis not present

## 2022-02-11 DIAGNOSIS — M1000 Idiopathic gout, unspecified site: Secondary | ICD-10-CM | POA: Diagnosis not present

## 2022-02-11 DIAGNOSIS — C61 Malignant neoplasm of prostate: Secondary | ICD-10-CM | POA: Diagnosis not present

## 2022-02-11 DIAGNOSIS — E7800 Pure hypercholesterolemia, unspecified: Secondary | ICD-10-CM | POA: Diagnosis not present

## 2022-02-11 LAB — CBC WITH DIFFERENTIAL/PLATELET
Basophils Absolute: 0 10*3/uL (ref 0.0–0.1)
Basophils Relative: 0.7 % (ref 0.0–3.0)
Eosinophils Absolute: 0.2 10*3/uL (ref 0.0–0.7)
Eosinophils Relative: 2.6 % (ref 0.0–5.0)
HCT: 41.9 % (ref 39.0–52.0)
Hemoglobin: 13.6 g/dL (ref 13.0–17.0)
Lymphocytes Relative: 23 % (ref 12.0–46.0)
Lymphs Abs: 1.6 10*3/uL (ref 0.7–4.0)
MCHC: 32.5 g/dL (ref 30.0–36.0)
MCV: 96.1 fl (ref 78.0–100.0)
Monocytes Absolute: 0.5 10*3/uL (ref 0.1–1.0)
Monocytes Relative: 7.4 % (ref 3.0–12.0)
Neutro Abs: 4.7 10*3/uL (ref 1.4–7.7)
Neutrophils Relative %: 66.3 % (ref 43.0–77.0)
Platelets: 161 10*3/uL (ref 150.0–400.0)
RBC: 4.36 Mil/uL (ref 4.22–5.81)
RDW: 15.1 % (ref 11.5–15.5)
WBC: 7.1 10*3/uL (ref 4.0–10.5)

## 2022-02-11 LAB — BASIC METABOLIC PANEL
BUN: 34 mg/dL — ABNORMAL HIGH (ref 6–23)
CO2: 28 mEq/L (ref 19–32)
Calcium: 9.4 mg/dL (ref 8.4–10.5)
Chloride: 104 mEq/L (ref 96–112)
Creatinine, Ser: 2 mg/dL — ABNORMAL HIGH (ref 0.40–1.50)
GFR: 31.45 mL/min — ABNORMAL LOW (ref >60.00)
Glucose, Bld: 87 mg/dL (ref 70–99)
Potassium: 4.1 mEq/L (ref 3.5–5.1)
Sodium: 141 mEq/L (ref 135–145)

## 2022-02-11 LAB — HEMOGLOBIN A1C: Hgb A1c MFr Bld: 6.5 % (ref 4.6–6.5)

## 2022-02-11 LAB — LIPID PANEL
Cholesterol: 115 mg/dL (ref 0–200)
HDL: 32.7 mg/dL — ABNORMAL LOW (ref >39.00)
LDL Cholesterol: 66 mg/dL (ref 0–99)
NonHDL: 82.55
Total CHOL/HDL Ratio: 4
Triglycerides: 82 mg/dL (ref 0.0–149.0)
VLDL: 16.4 mg/dL (ref 0.0–40.0)

## 2022-02-11 LAB — URIC ACID: Uric Acid, Serum: 8.7 mg/dL — ABNORMAL HIGH (ref 4.0–7.8)

## 2022-02-11 LAB — PSA, MEDICARE: PSA: 1.01 ng/ml (ref 0.10–4.00)

## 2022-02-11 MED ORDER — METOPROLOL SUCCINATE ER 100 MG PO TB24: ORAL_TABLET | ORAL | 1 refills | Status: AC

## 2022-02-11 MED ORDER — ATORVASTATIN CALCIUM 20 MG PO TABS: 20.0000 mg | ORAL_TABLET | Freq: Every day | ORAL | 1 refills | Status: AC

## 2022-02-11 NOTE — Progress Notes (Signed)
Acute Office Visit  Subjective:     Patient ID: Matthew Wilkerson, male    DOB: Sep 08, 1945, 76 y.o.   MRN: [id number]  Chief Complaint  Patient presents with   Hyperlipidemia   Hypertension    Pt needs a refill on his meds     HPI Patient is in today for a recheck of HLD, HTN, Gout, and a history of prostate CA. He is planning to establish care with [facility] in February but has run out of medications. He walks routinely for exercise. Reports having a COVID booster but declined the influenza vaccine.   Review of Systems  All other systems reviewed and are negative.  Past Medical History:  Diagnosis Date   Abnormal PSA 09/2011   7.63,    Allergy    BPH (benign prostatic hyperplasia)    Elevated PSA    Hernia    Hyperlipidemia    Hypertension    Inguinal hernia    left    Prostate CA ([place]) 01/08/12   bx=Adenocarcinoma, gleason 6 (3+3), prostate volume 43 cc   Prostate cancer ([place]) dx 11/2010   prostate ca,PSA=7.2 T1c volume=30cc   S/P radiation therapy within four to twelve weeks 08/23/12-10/18/12   prostate,7800cGy/40 sessions   Urinary obstruction     Social History   Socioeconomic History   Marital status: Divorced    Spouse name: Not on file   Number of children: 2   Years of education: Not on file   Highest education level: Not on file  Occupational History   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[employer]  Tobacco Use   Smoking status: Never   Smokeless tobacco: Never  Vaping Use   Vaping Use: Never used  Substance and Sexual Activity   Alcohol use: No   Drug use: Never    Types: MDMA (Ecstacy)   Sexual activity: Yes  Other Topics Concern   Not on file  Social History Narrative   Not on file   Social Determinants of Health   Financial Resource Strain: Low Risk  (12/07/2018)   Overall Financial Resource Strain (CARDIA)    Difficulty of Paying Living Expenses: Not hard at all  Food  Insecurity: No Food Insecurity (12/07/2018)   Hunger Vital Sign    Worried About Running Out of Food in the Last Year: Never true    Ran Out of Food in the Last Year: Never true  Transportation Needs: No Transportation Needs (12/07/2018)   PRAPARE - Hydrologist (Medical): No    Lack of Transportation (Non-Medical): No  Physical Activity: Insufficiently Active (12/07/2018)   Exercise Vital Sign    Days of Exercise per Week: 3 days    Minutes of Exercise per Session: 30 min  Stress: No Stress Concern Present (12/07/2018)   [place]    Feeling of Stress : Not at all  Social Connections: Unknown (12/07/2018)   Social Connection and Isolation Panel [NHANES]    Frequency of Communication with Friends and Family: Three times a week    Frequency of Social Gatherings with Friends and Family: Twice a week    Attends Religious Services: Patient refused    Active Member of Clubs or Organizations: Patient refused    Attends Archivist Meetings: Patient refused    Marital Status: Divorced  Human resources officer Violence: Not At Risk (12/07/2018)   Humiliation, Afraid, Rape,  and Kick questionnaire    Fear of Current or Ex-Partner: No    Emotionally Abused: No    Physically Abused: No    Sexually Abused: No    Past Surgical History:  Procedure Laterality Date   HERNIA REPAIR     right inguinal done in early 55's   PROSTATE BIOPSY  01/08/12   Adenocarcinoma,gleason=3+3=6,& 3+4=7,volume=43cc     Family History  Problem Relation Age of Onset   Cancer Sister 25       pancreatic    Allergies  Allergen Reactions   Aspirin Nausea Only    High dose asp.... He tolerates 81 mg    Codeine Other (See Comments)    Derivatives, "causes my sinuses to hurt"    Current Outpatient Medications on File Prior to Visit  Medication Sig Dispense Refill   aspirin EC 81 MG tablet Take 1 tablet (81 mg total)  by mouth daily.     colchicine 0.6 MG tablet Take 1.'2mg'$  (2 tablets) then 0.'6mg'$  (1 tablet) 1 hour after. Then, take 1 tablet every day for 7 days. 10 tablet 2   olmesartan-hydrochlorothiazide (BENICAR HCT) 40-25 MG tablet Take 1 tablet by mouth daily. 90 tablet 0   allopurinol (ZYLOPRIM) 100 MG tablet Take 0.5 tablets (50 mg total) by mouth daily. (Patient not taking: Reported on 02/11/2022) 90 tablet 1   No current facility-administered medications on file prior to visit.    BP 138/88   Pulse 78   Temp 98.1 F (36.7 C)   Ht '5\' 6"'$  (1.676 m)   Wt 208 lb 12.8 oz (94.7 kg)   SpO2 98%   BMI 33.70 kg/m chart      Objective:    BP 138/88   Pulse 78   Temp 98.1 F (36.7 C)   Ht '5\' 6"'$  (1.676 m)   Wt 208 lb 12.8 oz (94.7 kg)   SpO2 98%   BMI 33.70 kg/m    Physical Exam Vitals and nursing note reviewed.  Constitutional:      Appearance: Normal appearance.  Cardiovascular:     Rate and Rhythm: Normal rate and regular rhythm.     Pulses: Normal pulses.     Heart sounds: Normal heart sounds.  Pulmonary:     Effort: Pulmonary effort is normal.     Breath sounds: Normal breath sounds.  Abdominal:     General: Abdomen is flat.     Palpations: Abdomen is soft.  Musculoskeletal:        General: Normal range of motion.     Cervical back: Normal range of motion and neck supple.  Skin:    General: Skin is warm and dry.  Neurological:     General: No focal deficit present.     Mental Status: He is alert and oriented to person, place, and time.  Psychiatric:        Mood and Affect: Mood normal.        Behavior: Behavior normal.     No results found for any visits on 02/11/22.      Assessment & Plan:   Problem List Items Addressed This Visit     Prostate cancer ([place])   Relevant Orders   PSA, Medicare ( [place] only)   Hyperlipidemia - Primary   Relevant Medications   atorvastatin (LIPITOR) 20 MG tablet   metoprolol succinate (TOPROL-XL) 100 MG 24 hr tablet    Other Relevant Orders   Basic Metabolic Panel (BMET)   Lipid panel  CBC w/Diff   Essential hypertension   Relevant Medications   atorvastatin (LIPITOR) 20 MG tablet   metoprolol succinate (TOPROL-XL) 100 MG 24 hr tablet   Other Relevant Orders   Basic Metabolic Panel (BMET)   CBC w/Diff   Other Visit Diagnoses     Hyperglycemia       Relevant Orders   Hemoglobin J4G   Basic Metabolic Panel (BMET)   CBC w/Diff   Acute idiopathic gout, unspecified site       Relevant Orders   Uric acid       Meds ordered this encounter  Medications   atorvastatin (LIPITOR) 20 MG tablet    Sig: Take 1 tablet (20 mg total) by mouth daily. Around dinner time.    Dispense:  90 tablet    Refill:  1   metoprolol succinate (TOPROL-XL) 100 MG 24 hr tablet    Sig: TAKE 1 TABLET (100 MG TOTAL) BY MOUTH IN THE MORNING AND AT BEDTIME.    Dispense:  180 tablet    Refill:  1   Call the office with any questions or concerns. Recheck as scheduled and sooner as needed.  Return in about 6 months (around 08/13/2022).  Kennyth Arnold, FNP

## 2022-02-12 ENCOUNTER — Other Ambulatory Visit: Payer: Self-pay | Admitting: Family

## 2022-02-12 MED ORDER — OLMESARTAN MEDOXOMIL 40 MG PO TABS: 40.0000 mg | ORAL_TABLET | Freq: Every day | ORAL | 1 refills | Status: AC

## 2022-02-25 ENCOUNTER — Ambulatory Visit: Payer: Medicare Other | Admitting: Family

## 2022-04-17 ENCOUNTER — Other Ambulatory Visit: Payer: Self-pay | Admitting: Family Medicine

## 2022-04-17 DIAGNOSIS — I10 Essential (primary) hypertension: Secondary | ICD-10-CM

## 2022-08-06 ENCOUNTER — Other Ambulatory Visit: Payer: Self-pay | Admitting: Family

## 2022-08-06 DIAGNOSIS — E7800 Pure hypercholesterolemia, unspecified: Secondary | ICD-10-CM
